# Patient Record
Sex: Female | Born: 1985 | Race: Asian | Hispanic: No | Marital: Married | Smoking: Never smoker
Health system: Southern US, Community
[De-identification: ages and names within clinical notes are randomized; demographics above are authoritative.]

## PROBLEM LIST (undated history)

## (undated) DIAGNOSIS — E039 Hypothyroidism, unspecified: Secondary | ICD-10-CM

## (undated) DIAGNOSIS — N83209 Unspecified ovarian cyst, unspecified side: Secondary | ICD-10-CM

## (undated) DIAGNOSIS — O24419 Gestational diabetes mellitus in pregnancy, unspecified control: Secondary | ICD-10-CM

## (undated) HISTORY — DX: Gestational diabetes mellitus in pregnancy, unspecified control: O24.419

## (undated) HISTORY — PX: NO PAST SURGERIES: SHX2092

## (undated) HISTORY — DX: Hypothyroidism, unspecified: E03.9

## (undated) HISTORY — DX: Unspecified ovarian cyst, unspecified side: N83.209

---

## 2016-06-29 LAB — OB RESULTS CONSOLE GBS: GBS: NEGATIVE

## 2017-01-03 LAB — OB RESULTS CONSOLE ABO/RH: RH Type: POSITIVE

## 2017-01-03 LAB — OB RESULTS CONSOLE GC/CHLAMYDIA
Chlamydia: NEGATIVE
GC PROBE AMP, GENITAL: NEGATIVE

## 2017-01-03 LAB — OB RESULTS CONSOLE HEPATITIS B SURFACE ANTIGEN: Hepatitis B Surface Ag: NEGATIVE

## 2017-01-03 LAB — OB RESULTS CONSOLE ANTIBODY SCREEN: Antibody Screen: NEGATIVE

## 2017-01-03 LAB — OB RESULTS CONSOLE RUBELLA ANTIBODY, IGM: RUBELLA: IMMUNE

## 2017-01-03 LAB — OB RESULTS CONSOLE RPR: RPR: NONREACTIVE

## 2017-01-03 LAB — OB RESULTS CONSOLE HIV ANTIBODY (ROUTINE TESTING): HIV: NONREACTIVE

## 2017-06-29 LAB — OB RESULTS CONSOLE GBS: GBS: NEGATIVE

## 2017-07-11 ENCOUNTER — Other Ambulatory Visit: Payer: Self-pay | Admitting: Obstetrics & Gynecology

## 2017-07-11 ENCOUNTER — Encounter (HOSPITAL_COMMUNITY): Payer: Self-pay | Admitting: *Deleted

## 2017-07-11 ENCOUNTER — Telehealth (HOSPITAL_COMMUNITY): Payer: Self-pay | Admitting: *Deleted

## 2017-07-11 NOTE — Telephone Encounter (Signed)
Preadmission screen  

## 2017-07-20 ENCOUNTER — Inpatient Hospital Stay (HOSPITAL_COMMUNITY)
Admission: AD | Admit: 2017-07-20 | Discharge: 2017-07-23 | DRG: 788 | Disposition: A | Discharge status: Home / Self Care | Payer: 59 | Attending: Obstetrics and Gynecology | Admitting: Obstetrics and Gynecology

## 2017-07-20 ENCOUNTER — Inpatient Hospital Stay (HOSPITAL_COMMUNITY): Payer: 59

## 2017-07-20 ENCOUNTER — Other Ambulatory Visit: Payer: Self-pay

## 2017-07-20 ENCOUNTER — Encounter (HOSPITAL_COMMUNITY): Payer: Self-pay

## 2017-07-20 ENCOUNTER — Inpatient Hospital Stay (HOSPITAL_COMMUNITY): Payer: 59 | Admitting: Anesthesiology

## 2017-07-20 ENCOUNTER — Encounter (HOSPITAL_COMMUNITY)
Admission: AD | Disposition: A | Discharge status: Home / Self Care | Payer: Self-pay | Source: Home / Self Care | Attending: Obstetrics and Gynecology

## 2017-07-20 DIAGNOSIS — O24419 Gestational diabetes mellitus in pregnancy, unspecified control: Secondary | ICD-10-CM

## 2017-07-20 DIAGNOSIS — D649 Anemia, unspecified: Secondary | ICD-10-CM | POA: Diagnosis present

## 2017-07-20 DIAGNOSIS — O9902 Anemia complicating childbirth: Secondary | ICD-10-CM | POA: Diagnosis present

## 2017-07-20 DIAGNOSIS — O4103X0 Oligohydramnios, third trimester, not applicable or unspecified: Principal | ICD-10-CM | POA: Diagnosis present

## 2017-07-20 DIAGNOSIS — Z3490 Encounter for supervision of normal pregnancy, unspecified, unspecified trimester: Secondary | ICD-10-CM | POA: Diagnosis present

## 2017-07-20 DIAGNOSIS — O24425 Gestational diabetes mellitus in childbirth, controlled by oral hypoglycemic drugs: Secondary | ICD-10-CM | POA: Diagnosis present

## 2017-07-20 DIAGNOSIS — E039 Hypothyroidism, unspecified: Secondary | ICD-10-CM | POA: Diagnosis present

## 2017-07-20 DIAGNOSIS — O99284 Endocrine, nutritional and metabolic diseases complicating childbirth: Secondary | ICD-10-CM | POA: Diagnosis present

## 2017-07-20 DIAGNOSIS — Z3A38 38 weeks gestation of pregnancy: Secondary | ICD-10-CM | POA: Diagnosis not present

## 2017-07-20 LAB — RPR: RPR Ser Ql: NONREACTIVE

## 2017-07-20 LAB — GLUCOSE, CAPILLARY
GLUCOSE-CAPILLARY: 89 mg/dL (ref 65–99)
Glucose-Capillary: 103 mg/dL — ABNORMAL HIGH (ref 65–99)
Glucose-Capillary: 115 mg/dL — ABNORMAL HIGH (ref 65–99)

## 2017-07-20 LAB — CBC
HCT: 36.6 % (ref 36.0–46.0)
Hemoglobin: 12.1 g/dL (ref 12.0–15.0)
MCH: 26.7 pg (ref 26.0–34.0)
MCHC: 33.1 g/dL (ref 30.0–36.0)
MCV: 80.6 fL (ref 78.0–100.0)
PLATELETS: 270 10*3/uL (ref 150–400)
RBC: 4.54 MIL/uL (ref 3.87–5.11)
RDW: 13.9 % (ref 11.5–15.5)
WBC: 9.3 10*3/uL (ref 4.0–10.5)

## 2017-07-20 LAB — TYPE AND SCREEN
ABO/RH(D): B POS
ANTIBODY SCREEN: NEGATIVE

## 2017-07-20 LAB — ABO/RH: ABO/RH(D): B POS

## 2017-07-20 SURGERY — Surgical Case
Anesthesia: Epidural

## 2017-07-20 MED ORDER — SIMETHICONE 80 MG PO CHEW
80.0000 mg | CHEWABLE_TABLET | ORAL | Status: DC
  Administered 2017-07-20 – 2017-07-23 (×3): 80 mg via ORAL
  Filled 2017-07-20 (×3): qty 1

## 2017-07-20 MED ORDER — FENTANYL CITRATE (PF) 250 MCG/5ML IJ SOLN
INTRAMUSCULAR | Status: DC | PRN
  Administered 2017-07-20: 100 ug via INTRAVENOUS

## 2017-07-20 MED ORDER — OXYTOCIN BOLUS FROM INFUSION: 500.0000 mL | Freq: Once | INTRAVENOUS | Status: DC

## 2017-07-20 MED ORDER — WITCH HAZEL-GLYCERIN EX PADS: 1.0000 "application " | MEDICATED_PAD | CUTANEOUS | Status: DC | PRN

## 2017-07-20 MED ORDER — CEFAZOLIN SODIUM-DEXTROSE 2-3 GM-%(50ML) IV SOLR
INTRAVENOUS | Status: DC | PRN
  Administered 2017-07-20: 2 g via INTRAVENOUS

## 2017-07-20 MED ORDER — COCONUT OIL OIL: 1.0000 "application " | TOPICAL_OIL | Status: DC | PRN

## 2017-07-20 MED ORDER — EPHEDRINE 5 MG/ML INJ: 10.0000 mg | INTRAVENOUS | Status: DC | PRN

## 2017-07-20 MED ORDER — FLEET ENEMA 7-19 GM/118ML RE ENEM: 1.0000 | ENEMA | RECTAL | Status: DC | PRN

## 2017-07-20 MED ORDER — LEVOTHYROXINE SODIUM 50 MCG PO TABS
50.0000 ug | ORAL_TABLET | Freq: Every day | ORAL | Status: DC
  Administered 2017-07-20: 50 ug via ORAL
  Filled 2017-07-20 (×2): qty 1

## 2017-07-20 MED ORDER — LACTATED RINGERS IV SOLN: 500.0000 mL | INTRAVENOUS | Status: DC | PRN

## 2017-07-20 MED ORDER — ACETAMINOPHEN 325 MG PO TABS: 650.0000 mg | ORAL_TABLET | ORAL | Status: DC | PRN

## 2017-07-20 MED ORDER — TERBUTALINE SULFATE 1 MG/ML IJ SOLN
0.2500 mg | Freq: Once | INTRAMUSCULAR | Status: AC | PRN
  Administered 2017-07-20: 0.25 mg via SUBCUTANEOUS

## 2017-07-20 MED ORDER — SOD CITRATE-CITRIC ACID 500-334 MG/5ML PO SOLN: 30.0000 mL | ORAL | Status: DC | PRN

## 2017-07-20 MED ORDER — LEVOTHYROXINE SODIUM 125 MCG PO TABS
125.0000 ug | ORAL_TABLET | Freq: Every day | ORAL | Status: DC
  Administered 2017-07-21 – 2017-07-23 (×3): 125 ug via ORAL
  Filled 2017-07-20 (×3): qty 1

## 2017-07-20 MED ORDER — PHENYLEPHRINE 40 MCG/ML (10ML) SYRINGE FOR IV PUSH (FOR BLOOD PRESSURE SUPPORT): 80.0000 ug | PREFILLED_SYRINGE | INTRAVENOUS | Status: DC | PRN

## 2017-07-20 MED ORDER — MORPHINE SULFATE (PF) 0.5 MG/ML IJ SOLN
INTRAMUSCULAR | Status: DC | PRN
  Administered 2017-07-20: 1 mg via INTRAVENOUS
  Administered 2017-07-20: 4 mg via EPIDURAL

## 2017-07-20 MED ORDER — SOD CITRATE-CITRIC ACID 500-334 MG/5ML PO SOLN
30.0000 mL | ORAL | Status: DC | PRN
  Administered 2017-07-20: 30 mL via ORAL
  Filled 2017-07-20: qty 15

## 2017-07-20 MED ORDER — LACTATED RINGERS IV SOLN
INTRAVENOUS | Status: DC
  Administered 2017-07-20: 11:00:00 via INTRAUTERINE

## 2017-07-20 MED ORDER — ONDANSETRON HCL 4 MG/2ML IJ SOLN: 4.0000 mg | Freq: Four times a day (QID) | INTRAMUSCULAR | Status: DC | PRN

## 2017-07-20 MED ORDER — MENTHOL 3 MG MT LOZG: 1.0000 | LOZENGE | OROMUCOSAL | Status: DC | PRN

## 2017-07-20 MED ORDER — OXYCODONE-ACETAMINOPHEN 5-325 MG PO TABS: 2.0000 | ORAL_TABLET | ORAL | Status: DC | PRN

## 2017-07-20 MED ORDER — LACTATED RINGERS IV SOLN
INTRAVENOUS | Status: DC
  Administered 2017-07-20: 11:00:00 via INTRAVENOUS

## 2017-07-20 MED ORDER — TERBUTALINE SULFATE 1 MG/ML IJ SOLN: 0.2500 mg | Freq: Once | INTRAMUSCULAR | Status: DC | PRN

## 2017-07-20 MED ORDER — DEXAMETHASONE SODIUM PHOSPHATE 4 MG/ML IJ SOLN
INTRAMUSCULAR | Status: DC | PRN
  Administered 2017-07-20: 4 mg via INTRAVENOUS

## 2017-07-20 MED ORDER — OXYTOCIN 40 UNITS IN LACTATED RINGERS INFUSION - SIMPLE MED
2.5000 [IU]/h | INTRAVENOUS | Status: DC
  Filled 2017-07-20: qty 1000

## 2017-07-20 MED ORDER — OXYTOCIN 40 UNITS IN LACTATED RINGERS INFUSION - SIMPLE MED: 1.0000 m[IU]/min | INTRAVENOUS | Status: DC

## 2017-07-20 MED ORDER — BUPIVACAINE HCL (PF) 0.25 % IJ SOLN
INTRAMUSCULAR | Status: DC | PRN
  Administered 2017-07-20: 10 mL

## 2017-07-20 MED ORDER — OXYCODONE-ACETAMINOPHEN 5-325 MG PO TABS: 1.0000 | ORAL_TABLET | ORAL | Status: DC | PRN

## 2017-07-20 MED ORDER — ONDANSETRON HCL 4 MG/2ML IJ SOLN
4.0000 mg | Freq: Four times a day (QID) | INTRAMUSCULAR | Status: DC | PRN
  Administered 2017-07-20: 4 mg via INTRAVENOUS

## 2017-07-20 MED ORDER — DIPHENHYDRAMINE HCL 50 MG/ML IJ SOLN: 12.5000 mg | INTRAMUSCULAR | Status: DC | PRN

## 2017-07-20 MED ORDER — SIMETHICONE 80 MG PO CHEW
80.0000 mg | CHEWABLE_TABLET | Freq: Three times a day (TID) | ORAL | Status: DC
  Administered 2017-07-22 – 2017-07-23 (×4): 80 mg via ORAL
  Filled 2017-07-20 (×6): qty 1

## 2017-07-20 MED ORDER — FENTANYL CITRATE (PF) 100 MCG/2ML IJ SOLN
INTRAMUSCULAR | Status: AC
  Filled 2017-07-20: qty 2

## 2017-07-20 MED ORDER — LACTATED RINGERS IV SOLN
500.0000 mL | Freq: Once | INTRAVENOUS | Status: AC
  Administered 2017-07-20: 500 mL via INTRAVENOUS

## 2017-07-20 MED ORDER — IBUPROFEN 600 MG PO TABS
600.0000 mg | ORAL_TABLET | Freq: Four times a day (QID) | ORAL | Status: DC
  Administered 2017-07-20 – 2017-07-23 (×10): 600 mg via ORAL
  Filled 2017-07-20 (×10): qty 1

## 2017-07-20 MED ORDER — LIDOCAINE HCL (PF) 1 % IJ SOLN: 30.0000 mL | INTRAMUSCULAR | Status: DC | PRN

## 2017-07-20 MED ORDER — ZOLPIDEM TARTRATE 5 MG PO TABS: 5.0000 mg | ORAL_TABLET | Freq: Every evening | ORAL | Status: DC | PRN

## 2017-07-20 MED ORDER — ALBUMIN HUMAN 5 % IV SOLN
INTRAVENOUS | Status: DC | PRN
  Administered 2017-07-20: 14:00:00 via INTRAVENOUS

## 2017-07-20 MED ORDER — DIPHENHYDRAMINE HCL 25 MG PO CAPS
25.0000 mg | ORAL_CAPSULE | Freq: Four times a day (QID) | ORAL | Status: DC | PRN
  Administered 2017-07-21: 25 mg via ORAL
  Filled 2017-07-20: qty 1

## 2017-07-20 MED ORDER — SENNOSIDES-DOCUSATE SODIUM 8.6-50 MG PO TABS
2.0000 | ORAL_TABLET | ORAL | Status: DC
  Administered 2017-07-20 – 2017-07-23 (×3): 2 via ORAL
  Filled 2017-07-20 (×3): qty 2

## 2017-07-20 MED ORDER — DIBUCAINE 1 % RE OINT: 1.0000 "application " | TOPICAL_OINTMENT | RECTAL | Status: DC | PRN

## 2017-07-20 MED ORDER — SODIUM CHLORIDE 0.9 % IR SOLN
Status: DC | PRN
  Administered 2017-07-20: 1000 mL

## 2017-07-20 MED ORDER — LACTATED RINGERS IV SOLN
INTRAVENOUS | Status: DC
  Administered 2017-07-20: 22:00:00 via INTRAVENOUS

## 2017-07-20 MED ORDER — PHENYLEPHRINE 40 MCG/ML (10ML) SYRINGE FOR IV PUSH (FOR BLOOD PRESSURE SUPPORT)
80.0000 ug | PREFILLED_SYRINGE | INTRAVENOUS | Status: DC | PRN
  Filled 2017-07-20: qty 10

## 2017-07-20 MED ORDER — LACTATED RINGERS IV SOLN: 500.0000 mL | Freq: Once | INTRAVENOUS | Status: DC

## 2017-07-20 MED ORDER — LACTATED RINGERS IV SOLN: INTRAVENOUS | Status: DC

## 2017-07-20 MED ORDER — LIDOCAINE HCL (PF) 1 % IJ SOLN
INTRAMUSCULAR | Status: DC | PRN
  Administered 2017-07-20 (×2): 5 mL via EPIDURAL

## 2017-07-20 MED ORDER — OXYTOCIN 40 UNITS IN LACTATED RINGERS INFUSION - SIMPLE MED: 2.5000 [IU]/h | INTRAVENOUS | Status: AC

## 2017-07-20 MED ORDER — FENTANYL 2.5 MCG/ML BUPIVACAINE 1/10 % EPIDURAL INFUSION (WH - ANES)
14.0000 mL/h | INTRAMUSCULAR | Status: DC | PRN
  Administered 2017-07-20: 12 mL/h via EPIDURAL
  Filled 2017-07-20: qty 100

## 2017-07-20 MED ORDER — OXYTOCIN 10 UNIT/ML IJ SOLN
INTRAMUSCULAR | Status: DC | PRN
  Administered 2017-07-20: 40 [IU] via INTRAVENOUS

## 2017-07-20 MED ORDER — SODIUM BICARBONATE 8.4 % IV SOLN
INTRAVENOUS | Status: DC | PRN
  Administered 2017-07-20 (×2): 5 mL via EPIDURAL

## 2017-07-20 MED ORDER — BUPIVACAINE HCL (PF) 0.25 % IJ SOLN
INTRAMUSCULAR | Status: AC
  Filled 2017-07-20: qty 30

## 2017-07-20 MED ORDER — SCOPOLAMINE 1 MG/3DAYS TD PT72
MEDICATED_PATCH | TRANSDERMAL | Status: DC | PRN
  Administered 2017-07-20: 1 via TRANSDERMAL

## 2017-07-20 MED ORDER — OXYTOCIN 40 UNITS IN LACTATED RINGERS INFUSION - SIMPLE MED
1.0000 m[IU]/min | INTRAVENOUS | Status: DC
  Administered 2017-07-20: 2 m[IU]/min via INTRAVENOUS

## 2017-07-20 MED ORDER — SIMETHICONE 80 MG PO CHEW: 80.0000 mg | CHEWABLE_TABLET | ORAL | Status: DC | PRN

## 2017-07-20 MED ORDER — PRENATAL MULTIVITAMIN CH
1.0000 | ORAL_TABLET | Freq: Every day | ORAL | Status: DC
  Administered 2017-07-21 – 2017-07-23 (×3): 1 via ORAL
  Filled 2017-07-20 (×3): qty 1

## 2017-07-20 MED ORDER — MORPHINE SULFATE (PF) 0.5 MG/ML IJ SOLN
INTRAMUSCULAR | Status: AC
  Filled 2017-07-20: qty 10

## 2017-07-20 MED ORDER — TERBUTALINE SULFATE 1 MG/ML IJ SOLN
0.2500 mg | Freq: Once | INTRAMUSCULAR | Status: DC | PRN
  Filled 2017-07-20: qty 1

## 2017-07-20 SURGICAL SUPPLY — 46 items
BARRIER ADHS 3X4 INTERCEED (GAUZE/BANDAGES/DRESSINGS) ×3 IMPLANT
BENZOIN TINCTURE PRP APPL 2/3 (GAUZE/BANDAGES/DRESSINGS) ×3 IMPLANT
CHLORAPREP W/TINT 26ML (MISCELLANEOUS) ×3 IMPLANT
CLAMP CORD UMBIL (MISCELLANEOUS) ×3 IMPLANT
CLOSURE WOUND 1/2 X4 (GAUZE/BANDAGES/DRESSINGS) ×1
CLOTH BEACON ORANGE TIMEOUT ST (SAFETY) ×3 IMPLANT
DRAPE C SECTION CLR SCREEN (DRAPES) ×3 IMPLANT
DRSG OPSITE POSTOP 4X10 (GAUZE/BANDAGES/DRESSINGS) ×3 IMPLANT
ELECT REM PT RETURN 9FT ADLT (ELECTROSURGICAL) ×3
ELECTRODE REM PT RTRN 9FT ADLT (ELECTROSURGICAL) ×1 IMPLANT
EXTRACTOR VACUUM M CUP 4 TUBE (SUCTIONS) IMPLANT
EXTRACTOR VACUUM M CUP 4' TUBE (SUCTIONS)
GLOVE BIOGEL PI IND STRL 7.0 (GLOVE) ×2 IMPLANT
GLOVE BIOGEL PI INDICATOR 7.0 (GLOVE) ×4
GLOVE ECLIPSE 6.5 STRL STRAW (GLOVE) ×3 IMPLANT
GOWN STRL REUS W/TWL LRG LVL3 (GOWN DISPOSABLE) ×6 IMPLANT
KIT ABG SYR 3ML LUER SLIP (SYRINGE) ×3 IMPLANT
NEEDLE HYPO 22GX1.5 SAFETY (NEEDLE) ×3 IMPLANT
NEEDLE HYPO 25X5/8 SAFETYGLIDE (NEEDLE) IMPLANT
NS IRRIG 1000ML POUR BTL (IV SOLUTION) ×3 IMPLANT
PACK C SECTION WH (CUSTOM PROCEDURE TRAY) ×3 IMPLANT
PAD OB MATERNITY 4.3X12.25 (PERSONAL CARE ITEMS) ×3 IMPLANT
RETRACTOR TRAXI PANNICULUS (MISCELLANEOUS) ×1 IMPLANT
RTRCTR C-SECT PINK 25CM LRG (MISCELLANEOUS) ×3 IMPLANT
SPONGE LAP 18X18 X RAY DECT (DISPOSABLE) ×3 IMPLANT
STRIP CLOSURE SKIN 1/2X4 (GAUZE/BANDAGES/DRESSINGS) ×2 IMPLANT
SUT CHROMIC GUT AB #0 18 (SUTURE) IMPLANT
SUT MNCRL 0 VIOLET CTX 36 (SUTURE) ×3 IMPLANT
SUT MON AB 2-0 SH 27 (SUTURE)
SUT MON AB 2-0 SH27 (SUTURE) IMPLANT
SUT MON AB 3-0 SH 27 (SUTURE)
SUT MON AB 3-0 SH27 (SUTURE) IMPLANT
SUT MON AB 4-0 PS1 27 (SUTURE) IMPLANT
SUT MONOCRYL 0 CTX 36 (SUTURE) ×6
SUT PLAIN 2 0 (SUTURE)
SUT PLAIN 2 0 XLH (SUTURE) ×3 IMPLANT
SUT PLAIN ABS 2-0 CT1 27XMFL (SUTURE) IMPLANT
SUT VIC AB 0 CT1 36 (SUTURE) ×6 IMPLANT
SUT VIC AB 2-0 CT1 27 (SUTURE) ×2
SUT VIC AB 2-0 CT1 TAPERPNT 27 (SUTURE) ×1 IMPLANT
SUT VIC AB 4-0 KS 27 (SUTURE) ×3 IMPLANT
SUT VIC AB 4-0 PS2 27 (SUTURE) IMPLANT
SYR CONTROL 10ML LL (SYRINGE) ×6 IMPLANT
TOWEL OR 17X24 6PK STRL BLUE (TOWEL DISPOSABLE) ×3 IMPLANT
TRAXI PANNICULUS RETRACTOR (MISCELLANEOUS) ×2
TRAY FOLEY W/BAG SLVR 14FR LF (SET/KITS/TRAYS/PACK) IMPLANT

## 2017-07-20 NOTE — Progress Notes (Signed)
S: epidural comfortable O:BP 136/88   Pulse (!) 103   Temp 98.2 F (36.8 C) (Axillary)   Resp 18   Ht 5' 1.02" (1.55 m)   Wt 94.3 kg (208 lb)   SpO2 99%   BMI 39.27 kg/m  VE 6/patulous/-3/-2 (+) scalp stim Palp membrane AROM. Thick meconium IUPC/ISE placed CBG 103  Tracing: baseline 150's-160 (+) variables (+) late component Ctx  q 5 mins IMP; oligohydramnios Class A2 GDM Hypothyroidism IUP@ 38 5/7 wks P) amnioinfusion. Watch FHR closely

## 2017-07-20 NOTE — MAU Note (Signed)
Pt contracting since 1900 last night. Bleeding "like period" since 2130.  Denies LOF. +FM

## 2017-07-20 NOTE — Transfer of Care (Signed)
Immediate Anesthesia Transfer of Care Note  Patient: Heidi Barton  Procedure(s) Performed: CESAREAN SECTION (N/A )  Patient Location: PACU  Anesthesia Type:Epidural  Level of Consciousness: awake and alert   Airway & Oxygen Therapy: Patient Spontanous Breathing  Post-op Assessment: Report given to RN and Post -op Vital signs reviewed and stable  Post vital signs: Reviewed and stable  Last Vitals:  Vitals Value Taken Time  BP    Temp    Pulse    Resp    SpO2      Last Pain:  Vitals:   07/20/17 1114  TempSrc: Axillary  PainSc:          Complications: No apparent anesthesia complications

## 2017-07-20 NOTE — MAU Note (Signed)
0300- CNM Rogers at bedside to confirm Fetal presentation w/US  0315 - CNM Williams at bedside to perform SVE.  Will keep pt on monitor for an hour to evaluate labor as pt is painful and contracting regularly   0356 Called Dr Cherly Hensenousins w/ to report labor eval and NST - strip now non reactive with variables . Will continue to monitor and call Dr Cherly Hensenousins back.    0424Called Dr Cherly Hensenousins back. Strip still not reactive. BPP ordered.    Dr Cherly Hensenousins [facility]

## 2017-07-20 NOTE — Anesthesia Pain Management Evaluation Note (Signed)
  CRNA Pain Management Visit Note  Patient: Heidi BrochureShobana Fritze, 32 y.o., female  "Hello [facility] am a [facility] the [facility]. We have an anesthesia team available at all times to provide care throughout the hospital, including epidural management and anesthesia for C-section. I don't know your plan for the delivery whether it a natural birth, water birth, IV sedation, nitrous supplementation, doula or epidural, but we want to meet your pain goals."   1.Was your pain managed to your expectations on prior hospitalizations?   Yes   2.What is your expectation for pain management during this hospitalization?     Epidural  3.How can we help you reach that goal? Epidural placed and working well  Record the patient's initial score and the patient's pain goal.   Pain: 0  Pain Goal: 5 [facility] wants you to be able to say your pain was always managed very well.  Edison PaceWILKERSON,Mindie Rawdon 07/20/2017

## 2017-07-20 NOTE — H&P (Signed)
Heidi BrochureShobana Barton is a 32 y.o. female presenting @ 38 5/[redacted] weeks gestation for labor check. Pt reportedly had reactive tracing while system was on down time and when placed back on monitor showed 2 variables with ctx but ctx had decreased in frequency. Cervical exam per RN was 1/40%. Pt was therefore sent for BPP based on tracing and was found to be oligohydramnios and BPP 6/8. Pt is now being admitted for oligo   OB History    Gravida  2   Para  1   Term      Preterm      AB      Living  1     SAB      TAB      Ectopic      Multiple      Live Births  1          Past Medical History:  Diagnosis Date  . Gestational diabetes    glyburide  . Hypothyroidism   . Ovarian cyst    Past Surgical History:  Procedure Laterality Date  . NO PAST SURGERIES     Family History: family history includes Diabetes in her father. Social History:  reports that she has never smoked. She has never used smokeless tobacco. She reports that she does not drink alcohol or use drugs.     Maternal Diabetes: Yes:  Diabetes Type:  Insulin/Medication controlled Genetic Screening: Normal Maternal Ultrasounds/Referrals: Normal Fetal Ultrasounds or other Referrals:  None Maternal Substance Abuse:  No Significant Maternal Medications:  Meds include: Syntroid Other: glyburide Significant Maternal Lab Results:  Lab values include: Group B Strep negative Other Comments:  hypothyroidism  Review of Systems  All other systems reviewed and are negative.  History Dilation: 1 Effacement (%): 40 Station: -3 Exam by:: T Lytle RN  Blood pressure 133/85, pulse 82, temperature (!) 97.5 F (36.4 C), temperature source Oral, resp. rate 18. Exam Physical Exam  Constitutional: She is oriented to person, place, and time. She appears well-developed and well-nourished. She appears distressed.  Eyes: EOM are normal.  Neck: Neck supple.  Cardiovascular: Regular rhythm.  Respiratory: Breath sounds normal.   GI: Soft.  Musculoskeletal: She exhibits no edema.  Neurological: She is alert and oriented to person, place, and time.  Skin: Skin is warm and dry.    Prenatal labs: ABO, Rh: B/Positive/-- (11/27 0000) Antibody: Negative (11/27 0000) Rubella: Immune (11/27 0000) RPR: Nonreactive (11/27 0000)  HBsAg: Negative (11/27 0000)  HIV: Non-reactive (11/27 0000)  GBS: Negative (05/23 0000)   Assessment/Plan: Oligohydramnios Class A2GDM Hypothyroidism  IUP @ 38 5/7 weeks P) admit. Routine labs. Continue synthroid. CBG q 2hr, IVF. Analgesic/epidural prn. Intracervical balloon with pitocin augmentation    Taner Rzepka A Stephon Weathers 07/20/2017, 6:08 AM

## 2017-07-20 NOTE — Progress Notes (Signed)
S; notes rectal/vaginal pressure  O:  Amnioinfusion completed Still with variable decels some late but not repetitive VE unchanged. vtx -3  (+) mec fluid( thick)  Tracing: baseline 160 (+) variables some better variabiltiy Ctx q 1-5 mins IMP: arrest of dilation Oligohydramnios Class A2 GDM  hypothyroidism  P) disc with pt and husband that the infusion did not accomplish as much as I wanted ( resolution of variables) however they are not as deep. The infrequent ctx results in unchanged cervical exam , therefore will start pitocin to see if  Cervix Will dilate. If unable to tolerate then will need to proceed with C/S.  Express understanding

## 2017-07-20 NOTE — Anesthesia Postprocedure Evaluation (Signed)
Anesthesia Post Note  Patient: Markeria Edgin  Procedure(s) Performed: CESAREAN SECTION (N/A )     Patient location during evaluation: PACU Anesthesia Type: Epidural Level of consciousness: awake and alert, oriented and patient cooperative Pain management: pain level controlled Vital Signs Assessment: post-procedure vital signs reviewed and stable Respiratory status: spontaneous breathing, nonlabored ventilation and respiratory function stable Cardiovascular status: blood pressure returned to baseline and stable Postop Assessment: epidural receding, patient able to bend at knees and no apparent nausea or vomiting Anesthetic complications: no    Last Vitals:  Vitals:   07/20/17 1400 07/20/17 1415  BP: (!) 113/58 107/62  Pulse: 91 82  Resp: 19 19  Temp:    SpO2: 98% 97%    Last Pain:  Vitals:   07/20/17 1400  TempSrc:   PainSc: 0-No pain   Pain Goal:                 Salathiel Ferrara,E. Aleeha Boline

## 2017-07-20 NOTE — Brief Op Note (Signed)
07/20/2017  1:13 PM  PATIENT:  Heidi Barton  32 y.o. female  PRE-OPERATIVE DIAGNOSIS:  fetal intolerance to labor, Class A2 GDM, IUP @ 38 5/7 weeks  POST-OPERATIVE DIAGNOSIS:  fetal intolerance to labor, Class A2 GDM, IUP @ 38 5/7 weeks   PROCEDURE:  Primary Cesarean section, kerr hysterotomy  SURGEON:  Surgeon(s) and Role:    * Maxie Betterousins, Nocholas Damaso, MD - Primary  PHYSICIAN ASSISTANT:   ASSISTANTS: Marlinda Mikeanya Bailey, cnm   ANESTHESIA:   epidural FINDINGS: live female LOT, cord around legs. Nl tubes and ovaries, thin cord( unable to get cord ph). Post meconium stained placenta EBL:  848 mL   BLOOD ADMINISTERED:none  DRAINS: none   LOCAL MEDICATIONS USED:  MARCAINE     SPECIMEN:  Source of Specimen:  placenta   DISPOSITION OF SPECIMEN:  PATHOLOGY  COUNTS:  YES  TOURNIQUET:  * No tourniquets in log *  DICTATION: .Other Dictation: Dictation Number [id number]  PLAN OF CARE: Admit to inpatient   PATIENT DISPOSITION:  PACU - hemodynamically stable.   Delay start of Pharmacological VTE agent (>24hrs) due to surgical blood loss or risk of bleeding: no

## 2017-07-20 NOTE — Anesthesia Procedure Notes (Signed)
Epidural Patient location during procedure: OB Start time: 07/20/2017 8:10 AM End time: 07/20/2017 8:38 AM  Staffing Anesthesiologist: Jairo BenJackson, Damarcus Reggio, MD Performed: anesthesiologist   Preanesthetic Checklist Completed: patient identified, surgical consent, pre-op evaluation, timeout performed, IV checked, risks and benefits discussed and monitors and equipment checked  Epidural Patient position: sitting Prep: site prepped and draped and DuraPrep Patient monitoring: blood pressure, continuous pulse ox and heart rate Approach: midline Location: L3-L4 Injection technique: LOR air  Needle:  Needle type: Tuohy  Needle gauge: 17 G Needle length: 9 cm Needle insertion depth: 5 cm Catheter type: closed end flexible Catheter size: 19 Gauge Catheter at skin depth: 11 cm Test dose: negative (1% lidocaine)  Assessment Events: blood not aspirated, injection not painful, no injection resistance, negative IV test and no paresthesia  Additional Notes Pt identified in Labor room.  Monitors applied. Working IV access confirmed. Sterile prep, drape lumbar spine.  1% lido local L 2,3.  #17ga Touhy os, repeat local L 3,4 and #17ga Touhy LOR air at 5 cm, cath in easily to 11 cm skin. Test dose OK, cath dosed and infusion begun.  Patient asymptomatic, VSS, no heme aspirated, tolerated well.  Sandford Craze Leticia Mcdiarmid, MDReason for block:procedure for pain

## 2017-07-20 NOTE — Progress Notes (Signed)
S; breathing with ctx Denies SROM  O: BP (!) 142/81   Pulse 89   Temp 98.2 F (36.8 C) (Oral)   Resp 16   Wt 94.3 kg (208 lb)   VE 4/60/-3 Tracing: baseline 150 (+) irreg ctx, (+) variables with ctx  IMP: Labor Oligohydramnios Class A2 GDM Hypothyroidism Variable decelerations due to oligohydramnios P) Epidural. Amniotomy followed by IUPC for amnioinfusion. Defer pitocin augmentation at this time as she has progressed well on her own. Await BS check

## 2017-07-20 NOTE — Anesthesia Preprocedure Evaluation (Addendum)
Anesthesia Evaluation  Patient identified by MRN, date of birth, ID band Patient awake    Reviewed: Allergy & Precautions, NPO status , Patient's Chart, lab work & pertinent test results  History of Anesthesia Complications Negative for: history of anesthetic complications  Airway Mallampati: II  TM Distance: >3 FB Neck ROM: Full    Dental  (+) Dental Advisory Given   Pulmonary neg pulmonary ROS,    breath sounds clear to auscultation       Cardiovascular negative cardio ROS   Rhythm:Regular Rate:Normal     Neuro/Psych negative neurological ROS     GI/Hepatic negative GI ROS, Neg liver ROS,   Endo/Other  diabetes (glu 101), Gestational, Oral Hypoglycemic AgentsHypothyroidism   Renal/GU negative Renal ROS     Musculoskeletal   Abdominal   Peds  Hematology plt 270k   Anesthesia Other Findings   Reproductive/Obstetrics (+) Pregnancy                            Anesthesia Physical Anesthesia Plan  ASA: III  Anesthesia Plan: Epidural   Post-op Pain Management:    Induction:   PONV Risk Score and Plan: 2 and Treatment may vary due to age or medical condition  Airway Management Planned: Natural Airway  Additional Equipment:   Intra-op Plan:   Post-operative Plan:   Informed Consent: I have reviewed the patients History and Physical, chart, labs and discussed the procedure including the risks, benefits and alternatives for the proposed anesthesia with the patient or authorized representative who has indicated his/her understanding and acceptance.   Dental advisory given  Plan Discussed with:   Anesthesia Plan Comments: (Patient identified. Risks/Benefits/Options discussed with patient including but not limited to bleeding, infection, nerve damage, paralysis, failed block, incomplete pain control, headache, blood pressure changes, nausea, vomiting, reactions to medication both or  allergic, itching and postpartum back pain. Confirmed with bedside nurse the patient's most recent platelet count. Confirmed with patient that they are not currently taking any anticoagulation, have any bleeding history or any family history of bleeding disorders. Patient expressed understanding and wished to proceed. All questions were answered. )       Anesthesia Quick Evaluation

## 2017-07-21 LAB — CBC
HEMATOCRIT: 30 % — AB (ref 36.0–46.0)
HEMOGLOBIN: 9.7 g/dL — AB (ref 12.0–15.0)
MCH: 26.2 pg (ref 26.0–34.0)
MCHC: 32.3 g/dL (ref 30.0–36.0)
MCV: 81.1 fL (ref 78.0–100.0)
Platelets: 220 10*3/uL (ref 150–400)
RBC: 3.7 MIL/uL — ABNORMAL LOW (ref 3.87–5.11)
RDW: 14 % (ref 11.5–15.5)
WBC: 13 10*3/uL — AB (ref 4.0–10.5)

## 2017-07-21 MED ORDER — ACETAMINOPHEN 325 MG PO TABS
650.0000 mg | ORAL_TABLET | ORAL | Status: DC | PRN
  Administered 2017-07-21 – 2017-07-23 (×3): 650 mg via ORAL
  Filled 2017-07-21 (×5): qty 2

## 2017-07-21 NOTE — Anesthesia Postprocedure Evaluation (Signed)
Anesthesia Post Note  Patient: Teneil Granda  Procedure(s) Performed: CESAREAN SECTION (N/A )     Patient location during evaluation: Mother Baby Anesthesia Type: Epidural Level of consciousness: awake and alert and oriented Pain management: pain level controlled Vital Signs Assessment: post-procedure vital signs reviewed and stable Respiratory status: spontaneous breathing and nonlabored ventilation Cardiovascular status: stable Postop Assessment: no headache, no backache, patient able to bend at knees, no signs of nausea or vomiting, adequate PO intake and able to ambulate Anesthetic complications: no    Last Vitals:  Vitals:   07/21/17 0249 07/21/17 0618  BP: (!) 104/58 (!) 121/59  Pulse: 64   Resp: 15 16  Temp: 36.8 C 36.6 C  SpO2: 95% 97%    Last Pain:  Vitals:   07/21/17 0620  TempSrc:   PainSc: 0-No pain   Pain Goal:                 Madison HickmanGREGORY,Tjay Velazquez

## 2017-07-21 NOTE — Progress Notes (Addendum)
No c/o tol po, ambulating some; +flatus; voiding w/o difficulty Lochia wnl Pain controlled; pt does ask for abd binder  Temp:  [97.5 F (36.4 C)-98.6 F (37 C)] 97.9 F (36.6 C) (06/14 0618) Pulse Rate:  [64-95] 64 (06/14 0249) Resp:  [13-19] 16 (06/14 0618) BP: (72-126)/(39-76) 121/59 (06/14 0618) SpO2:  [93 %-99 %] 97 % (06/14 0618)  A&ox3 rrr ctab Abd: +bs, soft, nt, nd; fundus firm and 1cm below umb; dressing c/d/i LE: no edema, nt bilat  CBC Latest Ref Rng & Units 07/21/2017 07/20/2017  WBC 4.0 - 10.5 K/uL 13.0(H) 9.3  Hemoglobin 12.0 - 15.0 g/dL 1.9(J9.7(L) 47.812.1  Hematocrit 36.0 - 46.0 % 30.0(L) 36.6  Platelets 150 - 400 K/uL 220 270   A/P: pod 1 s/p ltcs for nrfht 1. Doing well, increase ambulation, contin current care 2. Acute anemia - asymptomatic and plan iron q day pp 3. gdma2 - will check fasting tomorrow am bs and hold meds; will have f/u pp

## 2017-07-21 NOTE — Addendum Note (Signed)
Addendum  created 07/21/17 0806 by Shanon PayorGregory, Aranza Geddes M, CRNA   Sign clinical note

## 2017-07-21 NOTE — Op Note (Signed)
NAMSheldon Silvan: Maler, [facility] MED[facility] REC[facility] [id number] ACCOUNT [id number] DATE OF BIRTH:1985/12/07 FACILITY: [facility] LOCATIO: [id number] PHYSICIAN:Claretha Townshend A. Rhiannon Sassaman, MD  OPERATIVE REPORT  DATE OF PROCEDURE:  07/20/2017  PREOPERATIVE DIAGNOSES:  Fetal intolerance to labor, class A2 gestational diabetes, intrauterine gestation at 38-5/7 weeks, oligohydramnios.  PROCEDURE:  Primary cesarean section, Kerr hysterotomy.  POSTOPERATIVE DIAGNOSES:  Fetal intolerance to labor, class A2 gestational diabetes, intrauterine gestation at 38-5/7 weeks, oligohydramnios.  ANESTHESIA:  Epidural.  SURGEON:  Maxie BetterSheronette Sian Rockers, MD  ASSISTANT:  Marlinda Mikeanya Bailey, CNM  DESCRIPTION OF PROCEDURE:  Under adequate epidural anesthesia, the patient was placed in the supine position with a left lateral tilt.  An indwelling Foley catheter was already in place.  Marcaine 0.25% was injected along the planned Pfannenstiel skin  incision site.  Pfannenstiel skin incision was then made, carried down to the rectus fascia.  The rectus fascia was opened transversely.  The rectus fascia was then bluntly and sharply dissected off the rectus muscle in a superior and inferior fashion.   The rectus muscle was split in the midline.  The parietal peritoneum was entered bluntly and extended.  A self-retaining Alexis retractor was then placed and not well developed lower uterine segment was noted.  Nonetheless, the vesicouterine peritoneum  was opened transversely.  The bladder was gently displaced inferiorly.  A curvilinear  uterine incision was then made, and green meconium amniotic fluid was noted.  Subsequent delivery of a live female from the left occiput transverse position with cord around both legs was noted.  Baby was bulb suctioned on the abdomen, cord was clamped after 1 minute, and Apgars were assigned by the waiting pediatricians at 8 and 8 at one and five minutes respectively.  Placenta was manually removed and sent to pathology.    Attempt at cord pH was unsuccessful as the the cord was thin.  The uterine cavity was cleaned of debris.  Uterine incision with no extension was closed in 2 layers, the first layer with 0 Monocryl in a running locked  stitch.  The second layer was imbricated using 0 Monocryl suture.  Normal tubes and ovaries were noted bilaterally.  The abdomen was irrigated and suctioned of debris.  Interceed was placed in an inverted T fashion overlying the lower uterine segment.   The Alexis retractor was then removed.  The parietal peritoneum was closed with 2-0 Vicryl.  The rectus fascia was closed with 0 Vicryl x2.  The subcutaneous area was irrigated, small bleeders cauterized.  Interrupted 2-0 plain suture was placed and the  skin approximated using 4-0 Vicryl subcuticular closure.  Steri-Strips and benzoin was placed.  SPECIMEN:  Placenta sent to pathology.  ESTIMATED BLOOD LOSS:  848 ml.  INTRAOPERATIVE FLUIDS:  1800 ml.  URINE OUTPUT:  100 mL.  COUNTS:  Sponge and instrument count x2 was correct.  COMPLICATIONS:  None.  DISPOSITION:  The patient tolerated the procedure well and was transferred to recovery room in stable condition.  LN/NUANCE  D:07/20/2017 T:07/21/2017 JOB:[id number]

## 2017-07-22 DIAGNOSIS — O24419 Gestational diabetes mellitus in pregnancy, unspecified control: Secondary | ICD-10-CM

## 2017-07-22 DIAGNOSIS — O9902 Anemia complicating childbirth: Secondary | ICD-10-CM | POA: Diagnosis not present

## 2017-07-22 LAB — GLUCOSE, CAPILLARY: GLUCOSE-CAPILLARY: 97 mg/dL (ref 65–99)

## 2017-07-22 MED ORDER — POLYSACCHARIDE IRON COMPLEX 150 MG PO CAPS
150.0000 mg | ORAL_CAPSULE | Freq: Every day | ORAL | Status: DC
  Administered 2017-07-22 – 2017-07-23 (×2): 150 mg via ORAL
  Filled 2017-07-22 (×2): qty 1

## 2017-07-22 MED ORDER — MAGNESIUM OXIDE 400 (241.3 MG) MG PO TABS
400.0000 mg | ORAL_TABLET | Freq: Every day | ORAL | Status: DC
  Administered 2017-07-22 – 2017-07-23 (×2): 400 mg via ORAL
  Filled 2017-07-22 (×3): qty 1

## 2017-07-22 NOTE — Lactation Note (Signed)
This note was copied from a baby's chart. Lactation Consultation Note  Patient Name: Heidi Barton BrochureShobana Gowens ZOXWR'UToday's Date: 07/22/2017 Reason for consult: Initial assessment;NICU baby Baby is 5244 hours old in the NICU.  Mom has started pumping with symphony pump.  She is putting baby to breast first and states he is sleepy at breast.  Instructed to pump and hand express every 3 hours. Discussed milk coming to volume.  Mom does not have a breast pump at home but plans on calling insurance company.  [facility] today.  Providing Breastmilk For Your Baby in NICU book given to patient.  Breastfeeding consultation services and support information also given to patient.  Maternal Data    Feeding Feeding Type: Formula  LATCH Score                   Interventions    Lactation Tools Discussed/Used Pump Review: Setup, frequency, and cleaning Initiated by:: RN Date initiated:: 07/21/17   Consult Status Consult Status: Follow-up Date: 07/23/17 Follow-up type: In-patient    Huston FoleyMOULDEN, Dayvion Sans S 07/22/2017, 8:56 AM

## 2017-07-22 NOTE — Progress Notes (Signed)
Subjective: POD# 2 Information for the patient's newborn:  Heidi Barton, [name] [[id number]][[id number]]  female  In NICU d/t glucose instability circ declined  Reports feeling sore, walking difficult d/t incision pain. Feeding: breast and bottle Patient reports tolerating PO.  Breast symptoms: pumping Pain controlled with PO meds Denies HA/SOB/C/P/N/V/dizziness. Flatus present. She reports vaginal bleeding as normal, without clots.  She is ambulating slowly, urinating without difficulty.     Objective:   VS:    Vitals:   07/21/17 0618 07/21/17 1333 07/21/17 2229 07/22/17 0606  BP: (!) 121/59 100/84 120/70 126/74  Pulse:  84 97 84  Resp: 16 16 18 18   Temp: 97.9 F (36.6 C) 98.4 F (36.9 C) 98.2 F (36.8 C) (!) 97.5 F (36.4 C)  TempSrc: Oral Axillary Axillary Axillary  SpO2: 97%     Weight:      Height:        No intake or output data in the 24 hours ending 07/22/17 1102      Recent Labs    07/20/17 0620 07/21/17 0657  WBC 9.3 13.0*  HGB 12.1 9.7*  HCT 36.6 30.0*  PLT 270 220     Blood type: --/--/B POS (06/13 0630)  Rubella: Immune (11/27 0000)     Physical Exam:  General: alert, cooperative and no distress Abdomen: pendulous, lax abdominal muscles, soft, NT Incision: clean, dry and intact Uterine Fundus: firm, below umbilicus, nontender Lochia: minimal Ext: no edema, redness or tenderness in the calves or thighs      Assessment/Plan: 32 y.o.   POD# 2. [id number]                  Principal Problem:   1C/S for FITL 6/13 Active Problems:   Encounter for planned induction of labor   Postpartum care following cesarean delivery   GDM, class A2   Maternal anemia, with delivery  - started oral Fe and Mag ox  Doing well, stable.    Breastfeeding support Encourage to ambulate, assistance with abdominal binder provided Routine post-op care  Heidi Barton, CNM, MSN 07/22/2017, 11:02 AM

## 2017-07-23 MED ORDER — OXYCODONE-ACETAMINOPHEN 5-325 MG PO TABS: 1.0000 | ORAL_TABLET | ORAL | 0 refills | Status: DC | PRN

## 2017-07-23 MED ORDER — POLYSACCHARIDE IRON COMPLEX 150 MG PO CAPS: 150.0000 mg | ORAL_CAPSULE | Freq: Every day | ORAL | 1 refills | Status: DC

## 2017-07-23 MED ORDER — SIMETHICONE 80 MG PO CHEW: 80.0000 mg | CHEWABLE_TABLET | ORAL | 0 refills | Status: DC | PRN

## 2017-07-23 MED ORDER — ACETAMINOPHEN 325 MG PO TABS: 650.0000 mg | ORAL_TABLET | ORAL | Status: DC | PRN

## 2017-07-23 MED ORDER — MAGNESIUM OXIDE 400 (241.3 MG) MG PO TABS: 400.0000 mg | ORAL_TABLET | Freq: Every day | ORAL | 1 refills | Status: DC

## 2017-07-23 MED ORDER — SENNOSIDES-DOCUSATE SODIUM 8.6-50 MG PO TABS: 2.0000 | ORAL_TABLET | ORAL | 1 refills | Status: DC

## 2017-07-23 MED ORDER — IBUPROFEN 600 MG PO TABS: 600.0000 mg | ORAL_TABLET | Freq: Four times a day (QID) | ORAL | 0 refills | Status: DC

## 2017-07-23 MED ORDER — COCONUT OIL OIL: 1.0000 "application " | TOPICAL_OIL | 0 refills | Status: DC | PRN

## 2017-07-23 NOTE — Lactation Note (Signed)
This note was copied from a baby's chart. Lactation Consultation Note  Patient Name: Boy Cammy BrochureShobana Hanford WUXLK'GToday's Date: 07/23/2017 Reason for consult: Follow-up assessment;NICU baby Mom is pumping every 3 hours and obtaining a few mls from each breast.  Parents may decide to rent a pump while they wait for pump from insurance company.  Baby still too sleepy to breastfeed.  Instructed to call for assist prn.  Maternal Data    Feeding    LATCH Score                   Interventions    Lactation Tools Discussed/Used     Consult Status Consult Status: Follow-up Follow-up type: Call as needed    Huston FoleyMOULDEN, Jamita Mckelvin S 07/23/2017, 10:36 AM

## 2017-07-23 NOTE — Discharge Summary (Addendum)
OB Discharge Summary     Patient Name: Heidi BrochureShobana Garceau DOB: 06/14/1985 MRN: [id number]  Date of admission: 07/20/2017 Delivering MD: Dorwin Fitzhenry   Date of discharge: 07/23/2017  Admitting diagnosis: oligohydramnios, BPP 6/10, Class A2 GDM, IUP @ 38 5/7 weeks, latent phase labor Intrauterine pregnancy: 852w5d     Secondary diagnosis:  Hypothyroidism.  Additional problems: none     Discharge diagnosis:  Oligohydramnios  Fetal intolerance to labor Hypothyroidism affecting pregnancy Class A2 GDM IUP@ 38 5/7 weeks delivered                                  Augmentation: Pitocin  Complications: None  Hospital course:  Onset of Labor With Unplanned C/S  32 y.o. yo G2P1002 at 482w5d was admitted in Latent Labor on 07/20/2017. Patient had a labor course significant for oligohydramnios and fetal intolerance to labor. Patient received an amnioinfusion which did not improve variable decelerations. Membrane Rupture Time/Date: 10:28 AM ,07/20/2017   The patient went for cesarean section due to Non-Reassuring FHR and remote from delivery, and delivered a Viable infant,07/20/2017  Details of operation can be found in separate operative note. Patient had an uncomplicated postpartum course.  She is ambulating,tolerating a regular diet, passing flatus, and urinating well.  Patient is discharged home in stable condition 07/23/17.  Physical exam  Vitals:   07/22/17 0606 07/22/17 1540 07/22/17 2258 07/23/17 0648  BP: 126/74 126/83 131/86 (!) 145/97  Pulse: 84 88 94 81  Resp: 18 18 17 18   Temp: (!) 97.5 F (36.4 C) 98 F (36.7 C) 97.7 F (36.5 C) 97.8 F (36.6 C)  TempSrc: Axillary Oral Oral Oral  SpO2:  100% 100%   Weight:      Height:       General: alert, cooperative and no distress Lochia: appropriate Uterine Fundus: firm Incision: Healing well with no significant drainage, Dressing is clean, dry, and intact DVT Evaluation: No cords or calf tenderness. No significant calf/ankle  edema. Labs: Lab Results  Component Value Date   WBC 13.0 (H) 07/21/2017   HGB 9.7 (L) 07/21/2017   HCT 30.0 (L) 07/21/2017   MCV 81.1 07/21/2017   PLT 220 07/21/2017   No flowsheet data found.  Discharge instruction: per After Visit Summary and "Baby and Me Booklet".  After visit meds:  Allergies as of 07/23/2017   No Known Allergies     Medication List    TAKE these medications   acetaminophen 325 MG tablet Commonly known as:  TYLENOL Take 2 tablets (650 mg total) by mouth every 4 (four) hours as needed for mild pain.   coconut oil Oil Apply 1 application topically as needed.   ibuprofen 600 MG tablet Commonly known as:  ADVIL,MOTRIN Take 1 tablet (600 mg total) by mouth every 6 (six) hours.   iron polysaccharides 150 MG capsule Commonly known as:  NIFEREX Take 1 capsule (150 mg total) by mouth daily. Start taking on:  07/24/2017   levothyroxine 125 MCG tablet Commonly known as:  SYNTHROID, LEVOTHROID Take 125 mcg by mouth daily before breakfast.   magnesium oxide 400 (241.3 Mg) MG tablet Commonly known as:  MAG-OX Take 1 tablet (400 mg total) by mouth daily. Start taking on:  07/24/2017   oxyCODONE-acetaminophen 5-325 MG tablet Commonly known as:  PERCOCET/ROXICET Take 1 tablet by mouth every 4 (four) hours as needed (pain scale 4-7).   senna-docusate 8.6-50 MG tablet Commonly  known as:  Senokot-S Take 2 tablets by mouth daily. Start taking on:  07/24/2017   simethicone 80 MG chewable tablet Commonly known as:  MYLICON Chew 1 tablet (80 mg total) by mouth as needed for flatulence.       Diet: routine diet  Activity: Advance as tolerated. Pelvic rest for 6 weeks.   Outpatient follow up:6 weeks Follow-up Information     Dr Shea Evans Specialty:  Obstetrics and Gynecology Contact information: [address] [place] [address] [phone]          Postpartum contraception: Not Discussed  Newborn Data: Live born female  Birth  Weight: 7 lb 5.6 oz (3335 g) APGAR: 8, 8  Newborn Delivery   Birth date/time:  07/20/2017 12:36:00 Delivery type:  C-Section, Low Transverse Trial of labor:  Yes C-section categorization:  Primary     Baby Feeding: Bottle and Breast Disposition:NICU for glucose instability, plan to remain inpatient for several additional days   07/23/2017 Neta Mends, CNM

## 2017-07-25 ENCOUNTER — Inpatient Hospital Stay (HOSPITAL_COMMUNITY): Admission: RE | Admit: 2017-07-25 | Payer: 59 | Source: Ambulatory Visit

## 2017-07-25 ENCOUNTER — Encounter (HOSPITAL_COMMUNITY): Payer: Self-pay | Admitting: Obstetrics and Gynecology

## 2017-07-25 NOTE — Addendum Note (Signed)
Addendum  created 07/25/17 0936 by Jairo BenJackson, Barnard Sharps, MD   Intraprocedure Event edited

## 2019-06-20 ENCOUNTER — Other Ambulatory Visit (HOSPITAL_COMMUNITY)
Admission: RE | Admit: 2019-06-20 | Discharge: 2019-06-20 | Disposition: A | Discharge status: Home / Self Care | Payer: 59 | Source: Ambulatory Visit | Attending: Family Medicine | Admitting: Family Medicine

## 2019-06-20 ENCOUNTER — Encounter: Payer: Self-pay | Admitting: Family Medicine

## 2019-06-20 ENCOUNTER — Ambulatory Visit (INDEPENDENT_AMBULATORY_CARE_PROVIDER_SITE_OTHER): Payer: 59 | Admitting: Family Medicine

## 2019-06-20 ENCOUNTER — Other Ambulatory Visit: Payer: Self-pay

## 2019-06-20 VITALS — BP 122/60 | HR 81 | Temp 98.0°F | Ht 63.0 in | Wt 161.8 lb

## 2019-06-20 DIAGNOSIS — Z0000 Encounter for general adult medical examination without abnormal findings: Secondary | ICD-10-CM | POA: Diagnosis not present

## 2019-06-20 DIAGNOSIS — N898 Other specified noninflammatory disorders of vagina: Secondary | ICD-10-CM | POA: Insufficient documentation

## 2019-06-20 DIAGNOSIS — Z789 Other specified health status: Secondary | ICD-10-CM

## 2019-06-20 DIAGNOSIS — E039 Hypothyroidism, unspecified: Secondary | ICD-10-CM | POA: Diagnosis not present

## 2019-06-20 LAB — CBC WITH DIFFERENTIAL/PLATELET
Basophils Absolute: 0.1 10*3/uL (ref 0.0–0.1)
Basophils Relative: 0.7 % (ref 0.0–3.0)
Eosinophils Absolute: 0.1 10*3/uL (ref 0.0–0.7)
Eosinophils Relative: 1.4 % (ref 0.0–5.0)
HCT: 37.4 % (ref 36.0–46.0)
Hemoglobin: 12.1 g/dL (ref 12.0–15.0)
Lymphocytes Relative: 18.9 % (ref 12.0–46.0)
Lymphs Abs: 1.6 10*3/uL (ref 0.7–4.0)
MCHC: 32.4 g/dL (ref 30.0–36.0)
MCV: 82.1 fl (ref 78.0–100.0)
Monocytes Absolute: 0.5 10*3/uL (ref 0.1–1.0)
Monocytes Relative: 5.8 % (ref 3.0–12.0)
Neutro Abs: 6.3 10*3/uL (ref 1.4–7.7)
Neutrophils Relative %: 73.2 % (ref 43.0–77.0)
Platelets: 344 10*3/uL (ref 150.0–400.0)
RBC: 4.55 Mil/uL (ref 3.87–5.11)
RDW: 12.7 % (ref 11.5–15.5)
WBC: 8.6 10*3/uL (ref 4.0–10.5)

## 2019-06-20 LAB — COMPREHENSIVE METABOLIC PANEL
ALT: 9 U/L (ref 0–35)
AST: 11 U/L (ref 0–37)
Albumin: 4.3 g/dL (ref 3.5–5.2)
Alkaline Phosphatase: 78 U/L (ref 39–117)
BUN: 10 mg/dL (ref 6–23)
CO2: 28 mEq/L (ref 19–32)
Calcium: 9.1 mg/dL (ref 8.4–10.5)
Chloride: 104 mEq/L (ref 96–112)
Creatinine, Ser: 0.44 mg/dL (ref 0.40–1.20)
GFR: 161.69 mL/min (ref >60.00)
Glucose, Bld: 94 mg/dL (ref 70–99)
Potassium: 4.3 mEq/L (ref 3.5–5.1)
Sodium: 136 mEq/L (ref 135–145)
Total Bilirubin: 0.3 mg/dL (ref 0.2–1.2)
Total Protein: 7.1 g/dL (ref 6.0–8.3)

## 2019-06-20 LAB — LIPID PANEL
Cholesterol: 208 mg/dL — ABNORMAL HIGH (ref 0–200)
HDL: 49.9 mg/dL (ref >39.00)
LDL Cholesterol: 126 mg/dL — ABNORMAL HIGH (ref 0–99)
NonHDL: 157.79
Total CHOL/HDL Ratio: 4
Triglycerides: 158 mg/dL — ABNORMAL HIGH (ref 0.0–149.0)
VLDL: 31.6 mg/dL (ref 0.0–40.0)

## 2019-06-20 LAB — TSH: TSH: 2.98 u[IU]/mL (ref 0.35–4.50)

## 2019-06-20 LAB — VITAMIN B12: Vitamin B-12: 133 pg/mL — ABNORMAL LOW (ref 211–911)

## 2019-06-20 LAB — VITAMIN D 25 HYDROXY (VIT D DEFICIENCY, FRACTURES): VITD: 11.9 ng/mL — ABNORMAL LOW (ref 30.00–100.00)

## 2019-06-20 LAB — T4, FREE: Free T4: 0.96 ng/dL (ref 0.60–1.60)

## 2019-06-20 NOTE — Progress Notes (Signed)
Patient: Heidi Barton MRN: [id number] DOB: Aug 15, 1985 PCP: Orland Mustard, MD     Subjective:  Chief Complaint  Patient presents with  . New Patient (Initial Visit)    No new concerns at this time.     HPI: The patient is a 34 y.o. female who presents today for annual exam. She denies any changes to past medical history. There have been no recent hospitalizations. They are not following a well balanced diet and exercise plan. Weight has been decreasing steadily. She does have complaints of vaginal odor at times. Can not tell me what exactly it smells like. Denies any itching or discharge. Sexually active with husband only. Would also like me to check her c/s scar. C/S in 2019.   She family hx of colon or breast cancer.   She is a vegetarian and has history of gestational diabetes. Her father has diabetes as well.   hypothyroidism She thinks she was diagnosed last year. Has not had thyroid labs checked in a bit. Does not complain of weight gain, difficulty swallowing, heat or cold intolerance, period irregularity, dry skin. Appears euthyroid.   Immunization History  Administered Date(s) Administered  . PFIZER SARS-COV-2 Vaccination 05/17/2019, 06/07/2019  . Tdap 06/18/2013   Colonoscopy: routine screening  Mammogram: routine screening  Pap smear: she will check on this.    Review of Systems  Constitutional: Negative for chills, fatigue and fever.  HENT: Negative for dental problem, ear pain, hearing loss, sore throat and trouble swallowing.   Eyes: Negative for visual disturbance.  Respiratory: Negative for cough, chest tightness and shortness of breath.   Cardiovascular: Negative for chest pain, palpitations and leg swelling.  Gastrointestinal: Negative for abdominal pain, blood in stool, diarrhea and nausea.  Endocrine: Negative for cold intolerance, polydipsia, polyphagia and polyuria.  Genitourinary: Negative for dysuria and hematuria.  Musculoskeletal: Negative for  arthralgias.  Skin: Negative for rash.  Neurological: Negative for dizziness and headaches.  Psychiatric/Behavioral: Negative for dysphoric mood and sleep disturbance. The patient is not nervous/anxious.     Allergies Patient has No Known Allergies.  Past Medical History Patient  has a past medical history of Gestational diabetes, Hypothyroidism, and Ovarian cyst.  Surgical History Patient  has a past surgical history that includes No past surgeries and Cesarean section (N/A, 07/20/2017).  Family History Pateint's family history includes Diabetes in her father.  Social History Patient  reports that she has never smoked. She has never used smokeless tobacco. She reports that she does not drink alcohol or use drugs.    Objective: Vitals:   06/20/19 1328  BP: 122/60  Pulse: 81  Temp: 98 F (36.7 C)  TempSrc: Temporal  SpO2: 98%  Weight: 161 lb 12.8 oz (73.4 kg)  Height: 5\' 3"  (1.6 m)    Body mass index is 28.66 kg/m.  Physical Exam Vitals reviewed. Exam conducted with a chaperone present.  Constitutional:      Appearance: She is well-developed.  HENT:     Right Ear: External ear normal.     Left Ear: External ear normal.  Eyes:     Conjunctiva/sclera: Conjunctivae normal.     Pupils: Pupils are equal, round, and reactive to light.  Neck:     Thyroid: No thyromegaly.  Cardiovascular:     Rate and Rhythm: Normal rate and regular rhythm.     Heart sounds: Normal heart sounds. No murmur.  Pulmonary:     Effort: Pulmonary effort is normal.     Breath sounds: Normal breath  sounds.  Abdominal:     General: Bowel sounds are normal. There is no distension.     Palpations: Abdomen is soft.     Tenderness: There is no abdominal tenderness.  Genitourinary:    Vagina: Vaginal discharge present.     Comments: Swab taken  Musculoskeletal:     Cervical back: Normal range of motion and neck supple.  Lymphadenopathy:     Cervical: No cervical adenopathy.  Skin:     General: Skin is warm and dry.     Findings: No rash.     Comments: Scar nicely healed.   Neurological:     Mental Status: She is alert and oriented to person, place, and time.     Cranial Nerves: No cranial nerve deficit.     Coordination: Coordination normal.     Deep Tendon Reflexes: Reflexes normal.  Psychiatric:        Behavior: Behavior normal.        Assessment/plan: 1. Annual physical exam Routine lab work today. HM reviewed. Asked that she look to see if UTD on her pap smear. Encouraged exercise and weight loss and discussed weekly guidelines for this. F/u in one year or as needed.  Patient counseling [x]    Nutrition: Stressed importance of moderation in sodium/caffeine intake, saturated fat and cholesterol, caloric balance, sufficient intake of fresh fruits, vegetables, fiber, calcium, iron, and 1 mg of folate supplement per day (for females capable of pregnancy).  [x]    Stressed the importance of regular exercise.   []    Substance Abuse: Discussed cessation/primary prevention of tobacco, alcohol, or other drug use; driving or other dangerous activities under the influence; availability of treatment for abuse.   [x]    Injury prevention: Discussed safety belts, safety helmets, smoke detector, smoking near bedding or upholstery.   [x]    Sexuality: Discussed sexually transmitted diseases, partner selection, use of condoms, avoidance of unintended pregnancy  and contraceptive alternatives.  [x]    Dental health: Discussed importance of regular tooth brushing, flossing, and dental visits.  [x]    Health maintenance and immunizations reviewed. Please refer to Health maintenance section.    - CBC with Differential/Platelet - Comprehensive metabolic panel - Lipid panel  2. Hypothyroidism (acquired) Labs and will refill med if wnl.  - TSH - T4, free  3. Vegetarian diet  - VITAMIN D 25 Hydroxy (Vit-D Deficiency, Fractures) - Vitamin B12   4. Vaginal odor -apithma swab for  BV/candida.   Patient counseling [x]    Nutrition: Stressed importance of moderation in sodium/caffeine intake, saturated fat and cholesterol, caloric balance, sufficient intake of fresh fruits, vegetables, fiber, calcium, iron, and 1 mg of folate supplement per day (for females capable of pregnancy).  [x]    Stressed the importance of regular exercise.   []    Substance Abuse: Discussed cessation/primary prevention of tobacco, alcohol, or other drug use; driving or other dangerous activities under the influence; availability of treatment for abuse.   [x]    Injury prevention: Discussed safety belts, safety helmets, smoke detector, smoking near bedding or upholstery.   [x]    Sexuality: Discussed sexually transmitted diseases, partner selection, use of condoms, avoidance of unintended pregnancy  and contraceptive alternatives.  [x]    Dental health: Discussed importance of regular tooth brushing, flossing, and dental visits.  [x]    Health maintenance and immunizations reviewed. Please refer to Health maintenance section.    Return in about 1 year (around 06/19/2020) for annual/labs .   Orma Flaming, MD [place]  06/20/2019

## 2019-06-20 NOTE — Patient Instructions (Addendum)
-check with your husband about your last pap smear. This is the screen for cervical cancer. Dr. Benjie Karvonen may have done this.   Encourage you to exercise 5x week or 120 minutes weekly.   Preventive Care 36-36 Years Old, Female Preventive care refers to visits with your health care provider and lifestyle choices that can promote health and wellness. This includes:  A yearly physical exam. This may also be called an annual well check.  Regular dental visits and eye exams.  Immunizations.  Screening for certain conditions.  Healthy lifestyle choices, such as eating a healthy diet, getting regular exercise, not using drugs or products that contain nicotine and tobacco, and limiting alcohol use. What can I expect for my preventive care visit? Physical exam Your health care provider will check your:  Height and weight. This may be used to calculate body mass index (BMI), which tells if you are at a healthy weight.  Heart rate and blood pressure.  Skin for abnormal spots. Counseling Your health care provider may ask you questions about your:  Alcohol, tobacco, and drug use.  Emotional well-being.  Home and relationship well-being.  Sexual activity.  Eating habits.  Work and work Statistician.  Method of birth control.  Menstrual cycle.  Pregnancy history. What immunizations do I need?  Influenza (flu) vaccine  This is recommended every year. Tetanus, diphtheria, and pertussis (Tdap) vaccine  You may need a Td booster every 10 years. Varicella (chickenpox) vaccine  You may need this if you have not been vaccinated. Human papillomavirus (HPV) vaccine  If recommended by your health care provider, you may need three doses over 6 months. Measles, mumps, and rubella (MMR) vaccine  You may need at least one dose of MMR. You may also need a second dose. Meningococcal conjugate (MenACWY) vaccine  One dose is recommended if you are age 40-21 years and a first-year college  student living in a residence hall, or if you have one of several medical conditions. You may also need additional booster doses. Pneumococcal conjugate (PCV13) vaccine  You may need this if you have certain conditions and were not previously vaccinated. Pneumococcal polysaccharide (PPSV23) vaccine  You may need one or two doses if you smoke cigarettes or if you have certain conditions. Hepatitis A vaccine  You may need this if you have certain conditions or if you travel or work in places where you may be exposed to hepatitis A. Hepatitis B vaccine  You may need this if you have certain conditions or if you travel or work in places where you may be exposed to hepatitis B. Haemophilus influenzae type b (Hib) vaccine  You may need this if you have certain conditions. You may receive vaccines as individual doses or as more than one vaccine together in one shot (combination vaccines). Talk with your health care provider about the risks and benefits of combination vaccines. What tests do I need?  Blood tests  Lipid and cholesterol levels. These may be checked every 5 years starting at age 36.  Hepatitis C test.  Hepatitis B test. Screening  Diabetes screening. This is done by checking your blood sugar (glucose) after you have not eaten for a while (fasting).  Sexually transmitted disease (STD) testing.  BRCA-related cancer screening. This may be done if you have a family history of breast, ovarian, tubal, or peritoneal cancers.  Pelvic exam and Pap test. This may be done every 3 years starting at age 36. Starting at age 36, this may be  done every 5 years if you have a Pap test in combination with an HPV test. Talk with your health care provider about your test results, treatment options, and if necessary, the need for more tests. Follow these instructions at home: Eating and drinking   Eat a diet that includes fresh fruits and vegetables, whole grains, lean protein, and low-fat  dairy.  Take vitamin and mineral supplements as recommended by your health care provider.  Do not drink alcohol if: ? Your health care provider tells you not to drink. ? You are pregnant, may be pregnant, or are planning to become pregnant.  If you drink alcohol: ? Limit how much you have to 0-1 drink a day. ? Be aware of how much alcohol is in your drink. In the [place] one drink equals one 12 oz bottle of beer (355 mL), one 5 oz glass of wine (148 mL), or one 1 oz glass of hard liquor (44 mL). Lifestyle  Take daily care of your teeth and gums.  Stay active. Exercise for at least 30 minutes on 5 or more days each week.  Do not use any products that contain nicotine or tobacco, such as cigarettes, e-cigarettes, and chewing tobacco. If you need help quitting, ask your health care provider.  If you are sexually active, practice safe sex. Use a condom or other form of birth control (contraception) in order to prevent pregnancy and STIs (sexually transmitted infections). If you plan to become pregnant, see your health care provider for a preconception visit. What's next?  Visit your health care provider once a year for a well check visit.  Ask your health care provider how often you should have your eyes and teeth checked.  Stay up to date on all vaccines. This information is not intended to replace advice given to you by your health care provider. Make sure you discuss any questions you have with your health care provider. Document Revised: 10/05/2017 Document Reviewed: 10/05/2017 Elsevier Patient Education  2020 [employer].

## 2019-06-21 ENCOUNTER — Encounter: Payer: Self-pay | Admitting: Family Medicine

## 2019-06-21 ENCOUNTER — Other Ambulatory Visit: Payer: Self-pay | Admitting: Family Medicine

## 2019-06-21 DIAGNOSIS — E538 Deficiency of other specified B group vitamins: Secondary | ICD-10-CM | POA: Insufficient documentation

## 2019-06-21 DIAGNOSIS — E559 Vitamin D deficiency, unspecified: Secondary | ICD-10-CM | POA: Insufficient documentation

## 2019-06-21 LAB — CERVICOVAGINAL ANCILLARY ONLY
Bacterial Vaginitis (gardnerella): NEGATIVE
Candida Glabrata: NEGATIVE
Candida Vaginitis: NEGATIVE
Comment: NEGATIVE
Comment: NEGATIVE
Comment: NEGATIVE

## 2019-06-21 MED ORDER — LEVOTHYROXINE SODIUM 125 MCG PO TABS: 125.0000 ug | ORAL_TABLET | Freq: Every day | ORAL | 3 refills | Status: DC

## 2019-06-21 MED ORDER — VITAMIN D (ERGOCALCIFEROL) 1.25 MG (50000 UNIT) PO CAPS: ORAL_CAPSULE | ORAL | 0 refills | Status: DC

## 2019-09-22 IMAGING — US US MFM FETAL BPP W/O NON-STRESS
1 series · 10 of 10 positions shown · non-contrast
Comparison: none

[Series 1: us mfm fetal bpp w/o non-stress · 10 acquisitions, 10 frames shown]
[im 1/10]
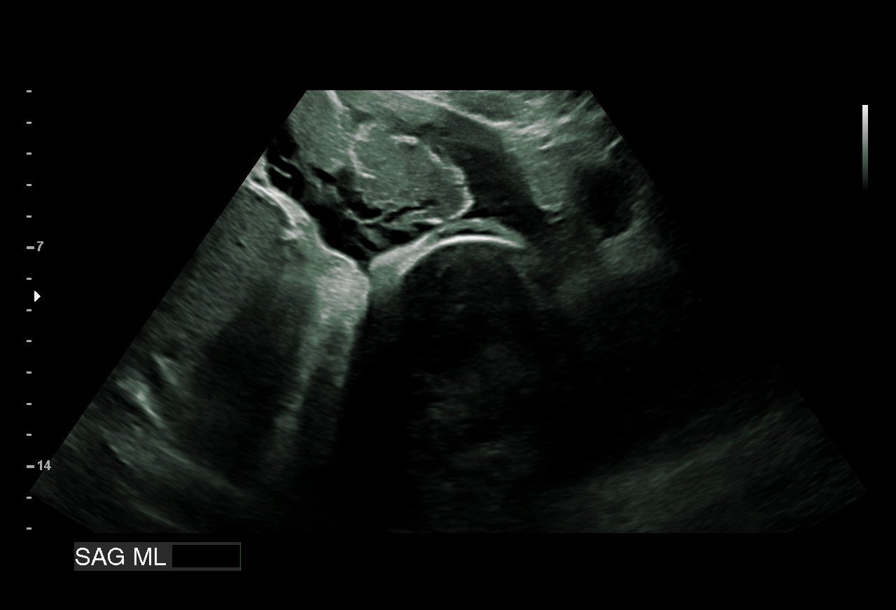
[im 2/10]
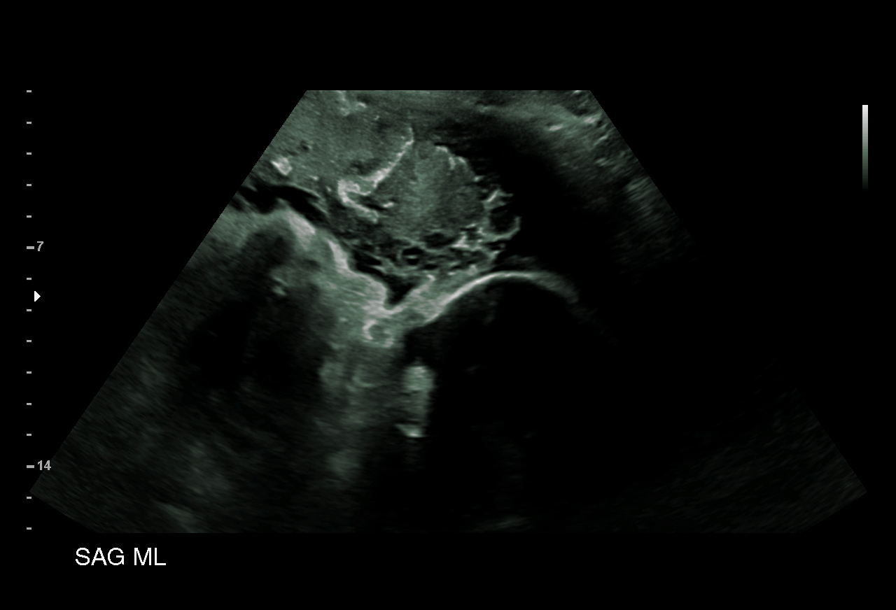
[im 3/10]
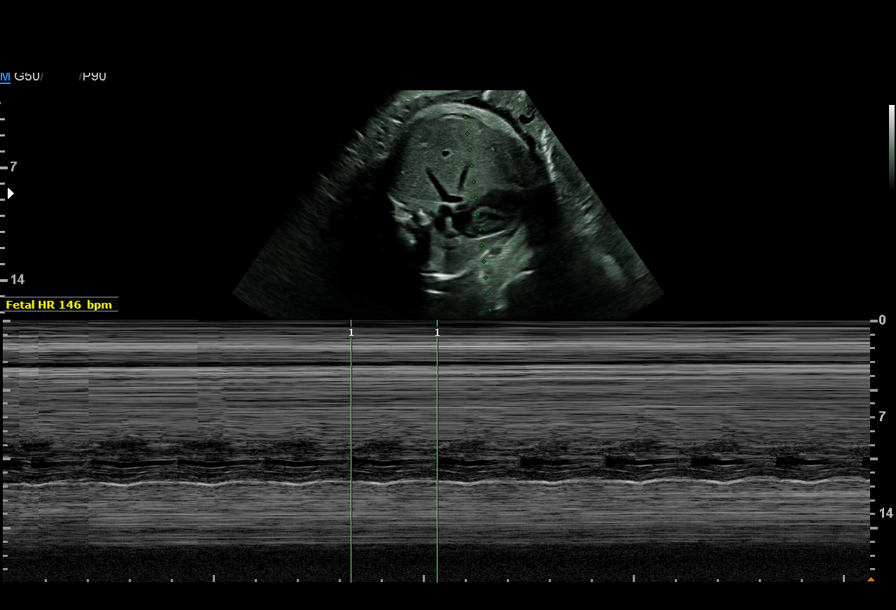
[im 4/10]
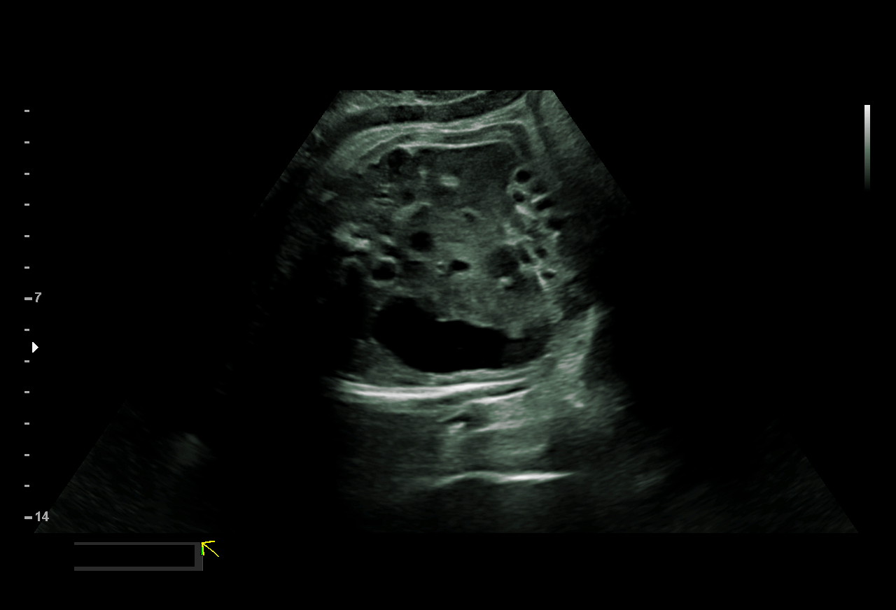
[im 5/10]
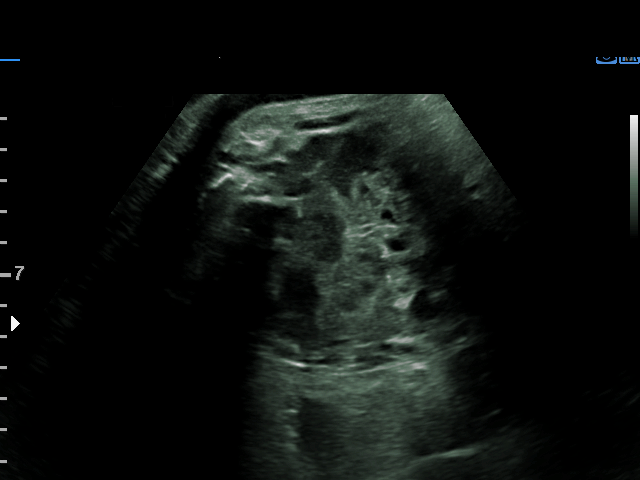
[im 6/10]
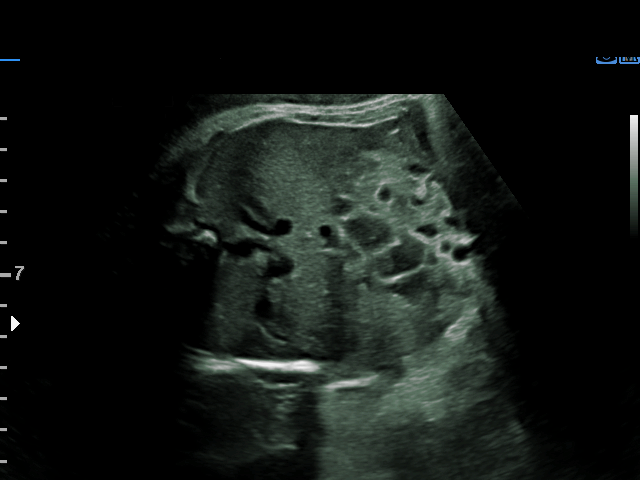
[im 7/10]
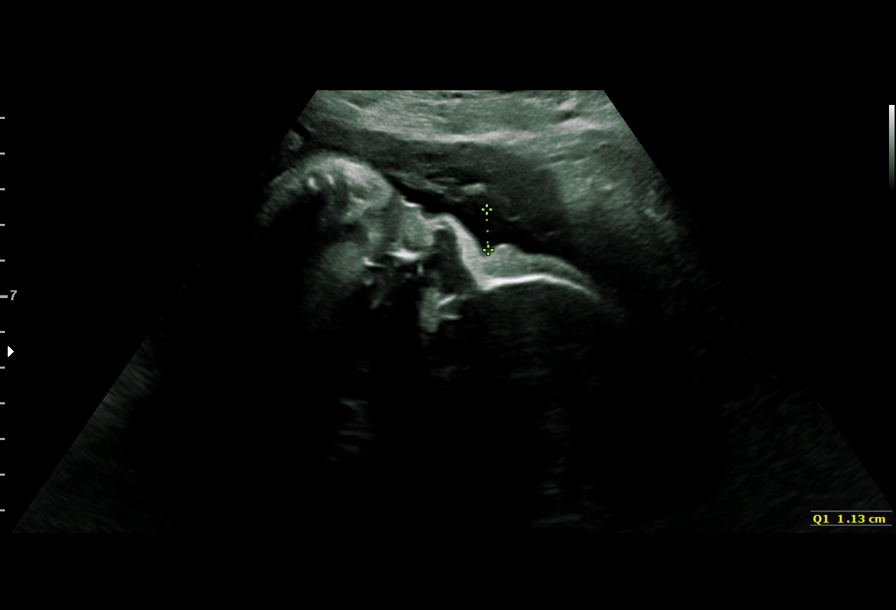
[im 8/10]
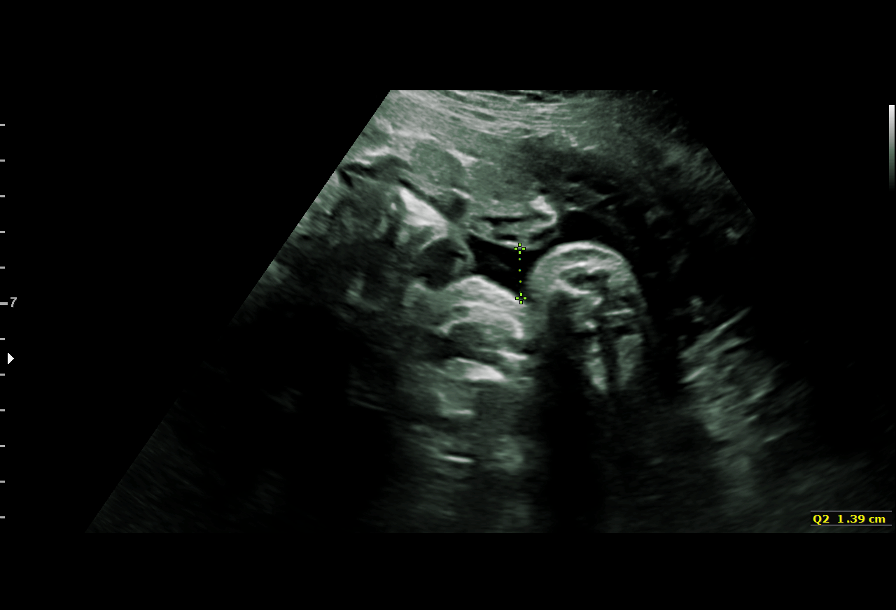
[im 9/10]
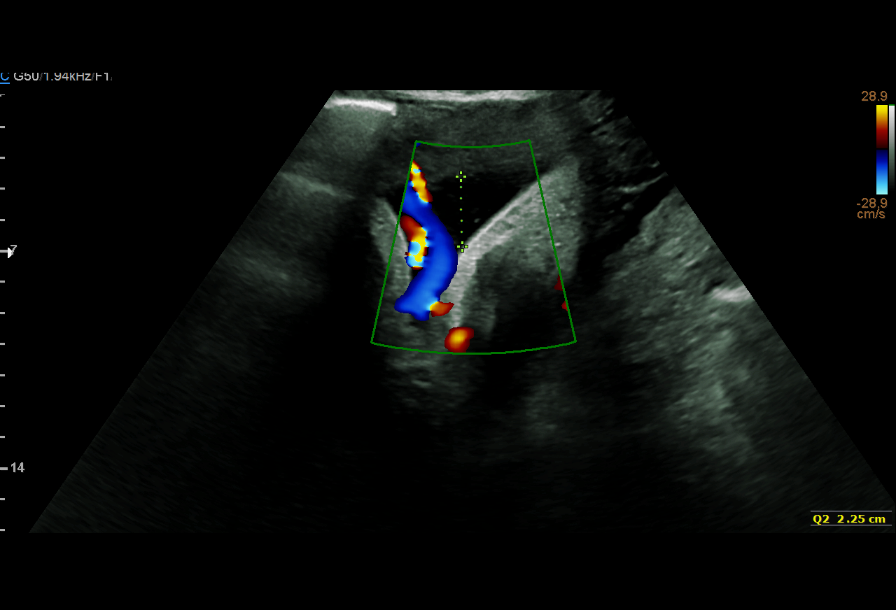
[im 10/10]
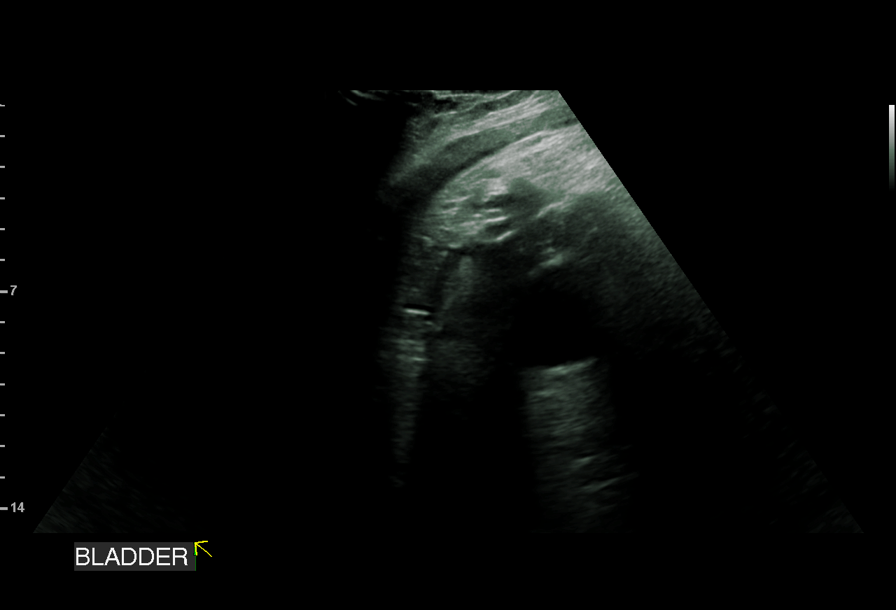

[10 of 10 positions shown; findings below may reference images not displayed]

[facility]
MAU/Triage

1  DAYANA               [id number]      [id number]     [id number]
[place]
Indications

38 weeks gestation of pregnancy
Vaginal bleeding in pregnancy, third trimester
Non-reactive NST
Gestational diabetes in pregnancy,
unspecified control
OB History

Gravidity:    2         Term:   1
Living:       1
Fetal Evaluation

Num Of Fetuses:     1
Fetal Heart         146
Rate(bpm):
Cardiac Activity:   Observed
Presentation:       Cephalic

Amniotic Fluid
AFI FV:      Oligohydramnios

AFI Sum(cm)     %Tile       Largest Pocket(cm)
2.95            < 3

RUQ(cm)                     LUQ(cm)
1.13
Biophysical Evaluation

Amniotic F.V:   Oligohydramnios            F. Tone:        Observed
F. Movement:    Observed                   Score:          [DATE]
F. Breathing:   Observed
Gestational Age

Clinical EDD:  38w 5d                                        EDD:   07/29/17
Best:          38w 5d     Det. By:  Clinical EDD             EDD:   07/29/17
Anatomy

Stomach:               Appears normal, left   Bladder:                Appears normal
sided
Cervix Uterus Adnexa

Cervix
Not visualized (advanced GA >77wks)
Impression

SIUP at 38+5 weeks
Cephalic presentation
Mild polyhydramnios
BPP [DATE] (-2 for oligo)
Recommendations

Follow-up as clinically indicated

## 2019-10-01 ENCOUNTER — Other Ambulatory Visit: Payer: Self-pay | Admitting: Family Medicine

## 2019-10-01 MED ORDER — VITAMIN D (ERGOCALCIFEROL) 1.25 MG (50000 UNIT) PO CAPS: ORAL_CAPSULE | ORAL | 0 refills | Status: DC

## 2019-12-18 ENCOUNTER — Other Ambulatory Visit: Payer: Self-pay | Admitting: Family Medicine

## 2020-06-22 ENCOUNTER — Encounter: Payer: Self-pay | Admitting: Family Medicine

## 2020-06-22 ENCOUNTER — Other Ambulatory Visit: Payer: Self-pay

## 2020-06-22 ENCOUNTER — Ambulatory Visit (INDEPENDENT_AMBULATORY_CARE_PROVIDER_SITE_OTHER): Payer: No Typology Code available for payment source | Admitting: Family Medicine

## 2020-06-22 VITALS — BP 124/84 | HR 82 | Temp 98.3°F | Ht 63.0 in | Wt 172.4 lb

## 2020-06-22 DIAGNOSIS — E538 Deficiency of other specified B group vitamins: Secondary | ICD-10-CM | POA: Diagnosis not present

## 2020-06-22 DIAGNOSIS — Z1159 Encounter for screening for other viral diseases: Secondary | ICD-10-CM

## 2020-06-22 DIAGNOSIS — Z0000 Encounter for general adult medical examination without abnormal findings: Secondary | ICD-10-CM

## 2020-06-22 DIAGNOSIS — E039 Hypothyroidism, unspecified: Secondary | ICD-10-CM

## 2020-06-22 DIAGNOSIS — E559 Vitamin D deficiency, unspecified: Secondary | ICD-10-CM

## 2020-06-22 LAB — CBC WITH DIFFERENTIAL/PLATELET
Basophils Absolute: 0.1 10*3/uL (ref 0.0–0.1)
Basophils Relative: 0.8 % (ref 0.0–3.0)
Eosinophils Absolute: 0.3 10*3/uL (ref 0.0–0.7)
Eosinophils Relative: 4.8 % (ref 0.0–5.0)
HCT: 37.8 % (ref 36.0–46.0)
Hemoglobin: 12.3 g/dL (ref 12.0–15.0)
Lymphocytes Relative: 24.6 % (ref 12.0–46.0)
Lymphs Abs: 1.8 10*3/uL (ref 0.7–4.0)
MCHC: 32.6 g/dL (ref 30.0–36.0)
MCV: 79.9 fl (ref 78.0–100.0)
Monocytes Absolute: 0.4 10*3/uL (ref 0.1–1.0)
Monocytes Relative: 5.2 % (ref 3.0–12.0)
Neutro Abs: 4.6 10*3/uL (ref 1.4–7.7)
Neutrophils Relative %: 64.6 % (ref 43.0–77.0)
Platelets: 335 10*3/uL (ref 150.0–400.0)
RBC: 4.73 Mil/uL (ref 3.87–5.11)
RDW: 13.3 % (ref 11.5–15.5)
WBC: 7.2 10*3/uL (ref 4.0–10.5)

## 2020-06-22 LAB — COMPREHENSIVE METABOLIC PANEL
ALT: 11 U/L (ref 0–35)
AST: 12 U/L (ref 0–37)
Albumin: 4.3 g/dL (ref 3.5–5.2)
Alkaline Phosphatase: 74 U/L (ref 39–117)
BUN: 8 mg/dL (ref 6–23)
CO2: 24 mEq/L (ref 19–32)
Calcium: 9.6 mg/dL (ref 8.4–10.5)
Chloride: 104 mEq/L (ref 96–112)
Creatinine, Ser: 0.46 mg/dL (ref 0.40–1.20)
GFR: 122.51 mL/min (ref >60.00)
Glucose, Bld: 81 mg/dL (ref 70–99)
Potassium: 4.2 mEq/L (ref 3.5–5.1)
Sodium: 137 mEq/L (ref 135–145)
Total Bilirubin: 0.4 mg/dL (ref 0.2–1.2)
Total Protein: 7.4 g/dL (ref 6.0–8.3)

## 2020-06-22 LAB — TSH: TSH: 4.2 u[IU]/mL (ref 0.35–4.50)

## 2020-06-22 LAB — LIPID PANEL
Cholesterol: 206 mg/dL — ABNORMAL HIGH (ref 0–200)
HDL: 54.3 mg/dL (ref >39.00)
LDL Cholesterol: 128 mg/dL — ABNORMAL HIGH (ref 0–99)
NonHDL: 151.83
Total CHOL/HDL Ratio: 4
Triglycerides: 119 mg/dL (ref 0.0–149.0)
VLDL: 23.8 mg/dL (ref 0.0–40.0)

## 2020-06-22 LAB — VITAMIN D 25 HYDROXY (VIT D DEFICIENCY, FRACTURES): VITD: 15.88 ng/mL — ABNORMAL LOW (ref 30.00–100.00)

## 2020-06-22 LAB — VITAMIN B12: Vitamin B-12: 120 pg/mL — ABNORMAL LOW (ref 211–911)

## 2020-06-22 LAB — T4, FREE: Free T4: 0.94 ng/dL (ref 0.60–1.60)

## 2020-06-22 MED ORDER — LEVOTHYROXINE SODIUM 125 MCG PO TABS: 125.0000 ug | ORAL_TABLET | Freq: Every day | ORAL | 3 refills | Status: DC

## 2020-06-22 NOTE — Patient Instructions (Signed)
-look at ways to increase B12 in your diet since vegetarian. If level is low we can either replace with shots or I will have you start B12 pills daily (1055mg/day), but let's get labs back first.   Same with vitamin D. This will be a separate pill outside of a multivitamin. We will see how lab is first.   Really encourage more exercise. 156mutes/week where you are working hard enough you can't carry on conversation.   Check to see about pap smear (screening for cervical cancer). If not would come back for this at your convenience.   Preventive Care [id number] Old, Female Preventive care refers to lifestyle choices and visits with your health care provider that can promote health and wellness. This includes:  A yearly physical exam. This is also called an annual wellness visit.  Regular dental and eye exams.  Immunizations.  Screening for certain conditions.  Healthy lifestyle choices, such as: ? Eating a healthy diet. ? Getting regular exercise. ? Not using drugs or products that contain nicotine and tobacco. ? Limiting alcohol use. What can I expect for my preventive care visit? Physical exam Your health care provider may check your:  Height and weight. These may be used to calculate your BMI (body mass index). BMI is a measurement that tells if you are at a healthy weight.  Heart rate and blood pressure.  Body temperature.  Skin for abnormal spots. Counseling Your health care provider may ask you questions about your:  Past medical problems.  Family's medical history.  Alcohol, tobacco, and drug use.  Emotional well-being.  Home life and relationship well-being.  Sexual activity.  Diet, exercise, and sleep habits.  Work and work enStatistician Access to firearms.  Method of birth control.  Menstrual cycle.  Pregnancy history. What immunizations do I need? Vaccines are usually given at various ages, according to a schedule. Your health care provider  will recommend vaccines for you based on your age, medical history, and lifestyle or other factors, such as travel or where you work.   What tests do I need? Blood tests  Lipid and cholesterol levels. These may be checked every 5 years starting at age 37 Hepatitis C test.  Hepatitis B test. Screening  Diabetes screening. This is done by checking your blood sugar (glucose) after you have not eaten for a while (fasting).  STD (sexually transmitted disease) testing, if you are at risk.  BRCA-related cancer screening. This may be done if you have a family history of breast, ovarian, tubal, or peritoneal cancers.  Pelvic exam and Pap test. This may be done every 3 years starting at age 2251Starting at age 1231this may be done every 5 years if you have a Pap test in combination with an HPV test. Talk with your health care provider about your test results, treatment options, and if necessary, the need for more tests.   Follow these instructions at home: Eating and drinking  Eat a healthy diet that includes fresh fruits and vegetables, whole grains, lean protein, and low-fat dairy products.  Take vitamin and mineral supplements as recommended by your health care provider.  Do not drink alcohol if: ? Your health care provider tells you not to drink. ? You are pregnant, may be pregnant, or are planning to become pregnant.  If you drink alcohol: ? Limit how much you have to 0-1 drink a day. ? Be aware of how much alcohol is in your drink. In the [place]  one drink equals one 12 oz bottle of beer (355 mL), one 5 oz glass of wine (148 mL), or one 1 oz glass of hard liquor (44 mL).   Lifestyle  Take daily care of your teeth and gums. Brush your teeth every morning and night with fluoride toothpaste. Floss one time each day.  Stay active. Exercise for at least 30 minutes 5 or more days each week.  Do not use any products that contain nicotine or tobacco, such as cigarettes, e-cigarettes, and  chewing tobacco. If you need help quitting, ask your health care provider.  Do not use drugs.  If you are sexually active, practice safe sex. Use a condom or other form of protection to prevent STIs (sexually transmitted infections).  If you do not wish to become pregnant, use a form of birth control. If you plan to become pregnant, see your health care provider for a prepregnancy visit.  Find healthy ways to cope with stress, such as: ? Meditation, yoga, or listening to music. ? Journaling. ? Talking to a trusted person. ? Spending time with friends and family. Safety  Always wear your seat belt while driving or riding in a vehicle.  Do not drive: ? If you have been drinking alcohol. Do not ride with someone who has been drinking. ? When you are tired or distracted. ? While texting.  Wear a helmet and other protective equipment during sports activities.  If you have firearms in your house, make sure you follow all gun safety procedures.  Seek help if you have been physically or sexually abused. What's next?  Go to your health care provider once a year for an annual wellness visit.  Ask your health care provider how often you should have your eyes and teeth checked.  Stay up to date on all vaccines. This information is not intended to replace advice given to you by your health care provider. Make sure you discuss any questions you have with your health care provider. Document Revised: 09/22/2019 Document Reviewed: 10/05/2017 Elsevier Patient Education  2021 [employer].

## 2020-06-22 NOTE — Progress Notes (Signed)
Patient: Heidi Barton MRN: [id number] DOB: [birth year] PCP: Orland Mustard, MD     Subjective:  Chief Complaint  Patient presents with  . Annual Exam  . Hypothyroidism  . Vitamin D Deficiency  . Vitamin B12 Deficiency    HPI: The patient is a 35 y.o. female who presents today for annual exam. She denies any changes to past medical history. There have been no recent hospitalizations. They are following a well balanced diet and exercise plan. She does sit ups once weekly. Weight has been fluctuating a bit. No complaints today. No family hx of colon or breast cancer. Has hx of diabetes in her father.   Hx of hypothyroidism. Currently on of synthroid. She denies any temperature intolerance, dysphagia, enlarged thyroid, etc. She appears euthyroid.   Vitamin D and b12 deficiencies. She takes 2 multivitamins, no D3 or B12 tablets. She has no fatigue, tingling or neuro complaints. She is a vegetarian.   Immunization History  Administered Date(s) Administered  . PFIZER(Purple Top)SARS-COV-2 Vaccination 05/17/2019, 06/07/2019, 01/25/2020  . Tdap 06/18/2013   Colonoscopy: routine screening Mammogram: routine screening  Pap smear: unsure, forgot to check for her records.    Review of Systems  Constitutional: Negative for chills, fatigue and fever.  HENT: Negative for dental problem, ear pain, hearing loss and trouble swallowing.   Eyes: Negative for visual disturbance.  Respiratory: Negative for cough, chest tightness and shortness of breath.   Cardiovascular: Negative for chest pain, palpitations and leg swelling.  Gastrointestinal: Negative for abdominal pain, blood in stool, diarrhea and nausea.  Endocrine: Negative for cold intolerance, polydipsia, polyphagia and polyuria.  Genitourinary: Negative for dysuria, hematuria and pelvic pain.  Musculoskeletal: Negative for arthralgias and back pain.  Skin: Negative for rash.  Neurological: Negative for dizziness and headaches.   Psychiatric/Behavioral: Negative for dysphoric mood and sleep disturbance. The patient is not nervous/anxious.     Allergies Patient has No Known Allergies.  Past Medical History Patient  has a past medical history of Gestational diabetes, Hypothyroidism, and Ovarian cyst.  Surgical History Patient  has a past surgical history that includes No past surgeries and Cesarean section (N/A, 07/20/2017).  Family History Pateint's family history includes Diabetes in her father.  Social History Patient  reports that she has never smoked. She has never used smokeless tobacco. She reports that she does not drink alcohol and does not use drugs.    Objective: Vitals:   06/22/20 0900  BP: 124/84  Pulse: 82  Temp: 98.3 F (36.8 C)  TempSrc: Temporal  SpO2: 100%  Weight: 172 lb 6.4 oz (78.2 kg)  Height: 5\' 3"  (1.6 m)    Body mass index is 30.54 kg/m.  Physical Exam Vitals reviewed.  Constitutional:      Appearance: Normal appearance. She is well-developed. She is obese.  HENT:     Head: Normocephalic and atraumatic.     Right Ear: Tympanic membrane, ear canal and external ear normal.     Left Ear: Tympanic membrane, ear canal and external ear normal.     Nose: Nose normal.     Mouth/Throat:     Mouth: Mucous membranes are moist.  Eyes:     Extraocular Movements: Extraocular movements intact.     Conjunctiva/sclera: Conjunctivae normal.     Pupils: Pupils are equal, round, and reactive to light.  Neck:     Thyroid: No thyromegaly.     Vascular: No carotid bruit.     Comments: No goiter Cardiovascular:  Rate and Rhythm: Normal rate and regular rhythm.     Pulses: Normal pulses.     Heart sounds: Normal heart sounds. No murmur heard.   Pulmonary:     Effort: Pulmonary effort is normal.     Breath sounds: Normal breath sounds.  Abdominal:     General: Bowel sounds are normal. There is no distension.     Palpations: Abdomen is soft.     Tenderness: There is no  abdominal tenderness.  Musculoskeletal:     Cervical back: Normal range of motion and neck supple.  Lymphadenopathy:     Cervical: No cervical adenopathy.  Skin:    General: Skin is warm and dry.     Capillary Refill: Capillary refill takes less than 2 seconds.     Findings: No rash.  Neurological:     General: No focal deficit present.     Mental Status: She is alert and oriented to person, place, and time.     Cranial Nerves: No cranial nerve deficit.     Coordination: Coordination normal.     Deep Tendon Reflexes: Reflexes normal.  Psychiatric:        Mood and Affect: Mood normal.        Behavior: Behavior normal.    Flowsheet Row Office Visit from 06/22/2020 in [facility]  PHQ-2 Total Score 0          Assessment/plan: 1. Annual physical exam Routine fasting labs today. HM reviewed. Discussed pap smear. She will check records and if needs come back or do at her annual. Discussed importance of more exercise and guidelines of 150 minutes/week of exercise. Encouraged weight loss. F/u in one year.  Patient counseling [x]    Nutrition: Stressed importance of moderation in sodium/caffeine intake, saturated fat and cholesterol, caloric balance, sufficient intake of fresh fruits, vegetables, fiber, calcium, iron, and 1 mg of folate supplement per day (for females capable of pregnancy).  [x]    Stressed the importance of regular exercise.   []    Substance Abuse: Discussed cessation/primary prevention of tobacco, alcohol, or other drug use; driving or other dangerous activities under the influence; availability of treatment for abuse.   [x]    Injury prevention: Discussed safety belts, safety helmets, smoke detector, smoking near bedding or upholstery.   [x]    Sexuality: Discussed sexually transmitted diseases, partner selection, use of condoms, avoidance of unintended pregnancy  and contraceptive alternatives.  [x]    Dental health: Discussed importance of regular  tooth brushing, flossing, and dental visits.  [x]    Health maintenance and immunizations reviewed. Please refer to Health maintenance section.    - CBC with Differential/Platelet - Comprehensive metabolic panel - Lipid panel  2. Hypothyroidism (acquired) Appears euthyroid. F/u in one year. Refills given.  - T4, free - TSH  3. Vitamin D deficiency Discussed needs to take a separate D3 pill, but we will see what levels are first.  - VITAMIN D 25 Hydroxy (Vit-D Deficiency, Fractures)  4. B12 deficiency -never followed up on this nor did injection vs. Oral repletion. On vegetarian diet, but taking 2 MV/day. Discussed looking into ways to increase her b12 via diet and discussed correction if still low.  - Vitamin B12  5. Encounter for hepatitis C screening test for low risk patient  - Hepatitis C antibody    Return in about 1 year (around 06/22/2021) for annual/pap with alyssa .     , MD [facility]  06/22/2020

## 2020-06-23 LAB — HEPATITIS C ANTIBODY
Hepatitis C Ab: NONREACTIVE
SIGNAL TO CUT-OFF: 0 (ref <1.00)

## 2020-06-24 ENCOUNTER — Other Ambulatory Visit: Payer: Self-pay | Admitting: Family Medicine

## 2020-06-24 MED ORDER — VITAMIN D (ERGOCALCIFEROL) 1.25 MG (50000 UNIT) PO CAPS: ORAL_CAPSULE | ORAL | 0 refills | Status: DC

## 2020-09-15 ENCOUNTER — Other Ambulatory Visit: Payer: Self-pay | Admitting: Family Medicine

## 2021-04-15 ENCOUNTER — Encounter: Payer: Self-pay | Admitting: Family Medicine

## 2021-04-15 ENCOUNTER — Ambulatory Visit (INDEPENDENT_AMBULATORY_CARE_PROVIDER_SITE_OTHER): Payer: No Typology Code available for payment source | Admitting: Family Medicine

## 2021-04-15 VITALS — BP 128/84 | HR 88 | Temp 98.6°F | Ht 63.0 in | Wt 171.0 lb

## 2021-04-15 DIAGNOSIS — E559 Vitamin D deficiency, unspecified: Secondary | ICD-10-CM

## 2021-04-15 DIAGNOSIS — E039 Hypothyroidism, unspecified: Secondary | ICD-10-CM

## 2021-04-15 DIAGNOSIS — E538 Deficiency of other specified B group vitamins: Secondary | ICD-10-CM

## 2021-04-15 MED ORDER — LEVOTHYROXINE SODIUM 125 MCG PO TABS: 125.0000 ug | ORAL_TABLET | Freq: Every day | ORAL | 3 refills | Status: DC

## 2021-04-15 NOTE — Assessment & Plan Note (Signed)
She is on a multivitamin.  She will need to have vitamin D rechecked with next blood draw. ?

## 2021-04-15 NOTE — Progress Notes (Signed)
? ?  Heidi Barton is a 36 y.o. female who presents today for an office visit. ? ?Assessment/Plan:  ?New/Acute Problems: ?Headache ?No red flags.  Reassuring neurologic exam today - doubt acute process or need to obtain any imaging at this point.  Likely secondary to untreated hypothyroidism and possibly dehydration.  We will restart her Synthroid as below and encouraged her to increase fluid intake.  She will let us know if not improving by next week. ? ?Chronic Problems Addressed Today: ?Hypothyroidism (acquired) ?She has been off of Synthroid for a few months due to her prescription running out.  We will refill today.  She will come back next month to establish with a new PCP.  We can recheck TSH at that time. ? ?Vitamin D deficiency ?She is on a multivitamin.  She will need to have vitamin D rechecked with next blood draw. ? ?B12 deficiency ?On multivitamin.  Needs B12 recheck next blood draw. ? ? ?  ?Subjective:  ?HPI: ? ?Patient here with headaches for the past 3 days.. Symptoms have been same for the last few days. She notes symptoms get worse whenever she bend down to pick something. Her husband notes she recently changed her diet and thinks this could be contributing. She is doing intermittent diet. She has tried ibuprofen with no relief. She notes sleeping helps with the symptoms. However, she get headache after waking up from sleep. She has been not drinking enough fluids as well. No weakness or numbness. No reported nausea or vomiting. No fever or chills. No vision changes. She denies any hx of migraine.  ? ?She notes she has been off of her thyroid medication for the past couple of months. She was on Synthroid 125 mcg in the past. Her husband notes she has been feeling more fatigued since being off of the medication. No reported fever or chills.  ? ?   ?  ?Objective:  ?Physical Exam: ?BP 128/84 (BP Location: Right Arm)   Pulse 88   Temp 98.6 ?F (37 ?C) (Temporal)   Ht 5\' 3"  (1.6 m)   Wt 171 lb  (77.6 kg)   SpO2 97%   BMI 30.29 kg/m?   ?Gen: No acute distress, resting comfortably ?CV: Regular rate and rhythm with no murmurs appreciated ?Pulm: Normal work of breathing, clear to auscultation bilaterally with no crackles, wheezes, or rhonchi ?Neuro: Cranial nerves II through XII intact.  Finger-nose-finger intact bilaterally.  Sensation to light touch intact throughout.  Reflexes 2+ and symmetric bilaterally.  Strength 5 out of 5 in all fields. ?Psych: Normal affect and thought content ? ?   ? ? ?I,Savera Zaman,acting as a for Neurosurgeon, MD.,have documented all relevant documentation on the behalf of Heidi Doe, MD,as directed by  Heidi Doe, MD while in the presence of Heidi Doe, MD.  ? ?I, Heidi Doe, MD, have reviewed all documentation for this visit. The documentation on 04/15/21 for the exam, diagnosis, procedures, and orders are all accurate and complete. ? ?06/15/21. Katina Degree, MD ?04/15/2021 3:43 PM  ? ?

## 2021-04-15 NOTE — Assessment & Plan Note (Signed)
On multivitamin.  Needs B12 recheck next blood draw. ?

## 2021-04-15 NOTE — Assessment & Plan Note (Signed)
She has been off of Synthroid for a few months due to her prescription running out.  We will refill today.  She will come back next month to establish with a new PCP.  We can recheck TSH at that time. ?

## 2021-04-15 NOTE — Patient Instructions (Signed)
It was very nice to see you today! ? ?We need to get you back on your thyroid medication.  I will refill your prescription today.  Please come back soon to establish with a new PCP and have blood work done. ? ?Let us know if your headache does not improve the next several days. ? ?Take care, ?Dr Jimmey Ralph ? ?PLEASE NOTE: ? ?If you had any lab tests please let us know if you have not heard back within a few days. You may see your results on mychart before we have a chance to review them but we will give you a call once they are reviewed by [place]. If we ordered any referrals today, please let us know if you have not heard from their office within the next week.  ? ?Please try these tips to maintain a healthy lifestyle: ? ?Eat at least 3 REAL meals and 1-2 snacks per day.  Aim for no more than 5 hours between eating.  If you eat breakfast, please do so within one hour of getting up.  ? ?Each meal should contain half fruits/vegetables, one quarter protein, and one quarter carbs (no bigger than a computer mouse) ? ?Cut down on sweet beverages. This includes juice, soda, and sweet tea.  ? ?Drink at least 1 glass of water with each meal and aim for at least 8 glasses per day ? ?Exercise at least 150 minutes every week.   ?

## 2021-05-10 ENCOUNTER — Ambulatory Visit (INDEPENDENT_AMBULATORY_CARE_PROVIDER_SITE_OTHER): Payer: No Typology Code available for payment source | Admitting: Family Medicine

## 2021-05-10 ENCOUNTER — Encounter: Payer: Self-pay | Admitting: Family Medicine

## 2021-05-10 VITALS — BP 120/82 | HR 88 | Temp 98.3°F | Ht 63.0 in | Wt 170.5 lb

## 2021-05-10 DIAGNOSIS — L659 Nonscarring hair loss, unspecified: Secondary | ICD-10-CM

## 2021-05-10 DIAGNOSIS — Z8632 Personal history of gestational diabetes: Secondary | ICD-10-CM

## 2021-05-10 DIAGNOSIS — M79672 Pain in left foot: Secondary | ICD-10-CM

## 2021-05-10 DIAGNOSIS — E559 Vitamin D deficiency, unspecified: Secondary | ICD-10-CM | POA: Diagnosis not present

## 2021-05-10 DIAGNOSIS — E538 Deficiency of other specified B group vitamins: Secondary | ICD-10-CM

## 2021-05-10 DIAGNOSIS — Z0000 Encounter for general adult medical examination without abnormal findings: Secondary | ICD-10-CM | POA: Diagnosis not present

## 2021-05-10 DIAGNOSIS — E039 Hypothyroidism, unspecified: Secondary | ICD-10-CM | POA: Diagnosis not present

## 2021-05-10 DIAGNOSIS — R5383 Other fatigue: Secondary | ICD-10-CM

## 2021-05-10 NOTE — Progress Notes (Signed)
?Phone [phone] ?  ?Subjective:  ? ?Patient is a 36 y.o. female presenting for annual physical.    Here w/husband ? Wellness ?Hypothyroidism-out of meds for 2-3 mo(ran out and no refills).  Hair falls out a lot for 3 yrs.  Was having HA and seen last mo.  Doing better.   Had c/s 2019.  Does get tired quickly and naps ?B12/D deficiency-gummy vitamin only(multi) ? ?Chief Complaint  ?Patient presents with  ? Transfer of Care  ?  Not fasting ?  ? ? ?See problem oriented charting- ?ROS- ROS: ?Gen: no fever, chills  ?Skin: no rash, itching ?ENT: no ear pain, ear drainage, nasal congestion, rhinorrhea, sinus pressure, sore throat ?Eyes: no blurry vision, double vision ?Resp: no cough, wheeze,SOB ?CV: no CP, palpitations, LE edema,  ?GI: no heartburn, n/v/d/c, abd pain ?GU: no dysuria, urgency, frequency, hematuria.  Nocturia x1.  LMP-3/14.  Cycles about 25 days past 4-5 months.  Was 28.   Pap-prob 2019 after pregnancy ?MSK: no joint pain, myalgias, back pain.  L foot pain-laterally esp if sitting on it.  ?Neuro: no dizziness, headache, weakness, vertigo ?Psych: no depression, anxiety, insomnia, SI  ? ?The following were reviewed and entered/updated in epic: ?Past Medical History:  ?Diagnosis Date  ? Gestational diabetes   ? glyburide  ? Hypothyroidism   ? Ovarian cyst   ? ?Patient Active Problem List  ? Diagnosis Date Noted  ? History of gestational diabetes 05/10/2021  ? B12 deficiency 06/21/2019  ? Vitamin D deficiency 06/21/2019  ? Hypothyroidism (acquired) 06/20/2019  ? ?Past Surgical History:  ?Procedure Laterality Date  ? CESAREAN SECTION N/A 07/20/2017  ? Procedure: CESAREAN SECTION;  Surgeon: Maxie Better, MD;  [facility];  Service: Obstetrics;  Laterality: N/A;  ? NO PAST SURGERIES    ? ? ?Family History  ?Problem Relation Age of Onset  ? Diabetes Father   ? ? ?Medications- reviewed and updated ?Current Outpatient Medications  ?Medication Sig Dispense Refill  ? levothyroxine  (SYNTHROID) 125 MCG tablet Take 1 tablet (125 mcg total) by mouth daily before breakfast. 90 tablet 3  ? VITAMIN D PO Take by mouth daily.    ? ?No current facility-administered medications for this visit.  ? ? ?Allergies-reviewed and updated ?No Known Allergies ? ?Social History  ? ?Social History Narrative  ? homemaker  ? ?Objective  ?Objective:  ?BP 120/82   Pulse 88   Temp 98.3 ?F (36.8 ?C) (Temporal)   Ht 5\' 3"  (1.6 m)   Wt 170 lb 8 oz (77.3 kg)   LMP 04/20/2021 (Exact Date)   SpO2 98%   Breastfeeding Unknown   BMI 30.20 kg/m?  ?Physical Exam  ?Gen: WDWN NAD of ?HEENT: NCAT, conjunctiva not injected, sclera nonicteric ?TM WNL B, OP moist, no exudates .  Some poor dentition ?NECK:  supple, mild thyromegaly, no nodes, no carotid bruits ?CARDIAC: RRR, S1S2+, no murmur. DP 2+B ?LUNGS: CTAB. No wheezes ?ABDOMEN:  BS+, soft, NTND, No HSM, no masses ?EXT:  no edema.   ?MSK: MS 5/5 all 4.  L lateral foot-tenderness a lot 5th metatarsal.  ?NEURO: A&O x3.  CN II-XII intact.  ?PSYCH: normal mood. Good eye contact  ? ?  ?Assessment and Plan  ? ?Health Maintenance counseling: ?1. Anticipatory guidance: Patient counseled regarding regular dental exams q6 months, eye exams,  avoiding smoking and second hand smoke, limiting alcohol to 1 beverage per day, no illicit drugs.   ?2. Risk factor reduction:  Advised patient  of need for regular exercise and diet rich and fruits and vegetables to reduce risk of heart attack and stroke. Exercise- increase to 5d/wk.  ?Wt Readings from Last 3 Encounters:  ?05/10/21 170 lb 8 oz (77.3 kg)  ?04/15/21 171 lb (77.6 kg)  ?06/22/20 172 lb 6.4 oz (78.2 kg)  ? ?3. Immunizations/screenings/ancillary studies ?Immunization History  ?Administered Date(s) Administered  ? PFIZER(Purple Top)SARS-COV-2 Vaccination 05/17/2019, 06/07/2019, 01/25/2020  ? Tdap 06/18/2013  ? ?Health Maintenance Due  ?Topic Date Due  ? PAP SMEAR-Modifier  Never done  ?  ?4. Cervical cancer screening: will do next yr ?5.  Skin cancer screening- advised regular sunscreen use. Denies worrisome, changing, or new skin lesions.  ?6. Birth control/STD check: condoms ?7. Smoking associated screening: non smoker ?8. Alcohol screening: none ? ?Problem List Items Addressed This Visit   ? ?  ? Endocrine  ? Hypothyroidism (acquired)  ?  ? Other  ? B12 deficiency  ? Vitamin D deficiency  ? History of gestational diabetes  ? ?Other Visit Diagnoses   ? ? Wellness examination    -  Primary  ? Left foot pain      ? Relevant Orders  ? DG Foot Complete Left  ? Other fatigue      ? Alopecia      ? ?  ? Wellness-[facility] guidance.  Will do pap next yr.  Work on [employer].  Check labs ?Hypothyroidism-was out of meds 2-3 mo.  Back on for 3 wks,  too soon to check.  Return fasting 3 wks for labs ?Vitamin D deficiency-only on gummy vit.  Check vit D ?Vitamin B12 deficiency(vegetarian).  On multi vit.  Check B12 ?Fatigue/alopecia-check iron studies ?H/o gest DM-work on TLC.  At risk of DM-check A1C ?L foot pain-stretches.  Check x-ray ? ?Recommended follow up: annualReturn for 3 weeks for labs fasting.     1 yr annual w/pap. ?Future Appointments  ?Date Time Provider Department Center  ?06/01/2021  8:15 AM [facility]  ?05/12/2022 10:00 AM Jeani Sow, MD [facility]  ? ? ?Lab/Order associations:return fasting ?  ICD-10-CM   ?1. Wellness examination  Z00.00   ?  ?2. Hypothyroidism (acquired)  E03.9   ?  ?3. B12 deficiency  E53.8   ?  ?4. Vitamin D deficiency  E55.9   ?  ?5. Left foot pain  M79.672 DG Foot Complete Left  ?  ?6. History of gestational diabetes  Z75.32   ?  ?7. Other fatigue  R53.83   ?  ?8. Alopecia  L65.9   ?  ? ? ?No orders of the defined types were placed in this encounter. ? ?  ?Angelena Sole, MD ? ? ? ?

## 2021-05-10 NOTE — Patient Instructions (Addendum)
It was very nice to see you today! ? ?Work on exercise 5 days/week ?Track periods. ?Stop vitamin 3 days before labs ?[address] for x-ray-M-F 8:30-5.  Closed lunch 12:30-1 ? ? ?PLEASE NOTE: ? ?If you had any lab tests please let us know if you have not heard back within a few days. You may see your results on MyChart before we have a chance to review them but we will give you a call once they are reviewed by [place]. If we ordered any referrals today, please let us know if you have not heard from their office within the next week.  ? ?Please try these tips to maintain a healthy lifestyle: ? ?Eat most of your calories during the day when you are active. Eliminate processed foods including packaged sweets (pies, cakes, cookies), reduce intake of potatoes, white bread, white pasta, and white rice. Look for whole grain options, oat flour or almond flour. ? ?Each meal should contain half fruits/vegetables, one quarter protein, and one quarter carbs (no bigger than a computer mouse). ? ?Cut down on sweet beverages. This includes juice, soda, and sweet tea. Also watch fruit intake, though this is a healthier sweet option, it still contains natural sugar! Limit to 3 servings daily. ? ?Drink at least 1 glass of water with each meal and aim for at least 8 glasses per day ? ?Exercise at least 150 minutes every week.   ?

## 2021-06-01 ENCOUNTER — Other Ambulatory Visit (INDEPENDENT_AMBULATORY_CARE_PROVIDER_SITE_OTHER): Payer: No Typology Code available for payment source

## 2021-06-01 ENCOUNTER — Other Ambulatory Visit: Payer: Self-pay | Admitting: Physician Assistant

## 2021-06-01 DIAGNOSIS — E559 Vitamin D deficiency, unspecified: Secondary | ICD-10-CM

## 2021-06-01 DIAGNOSIS — Z8632 Personal history of gestational diabetes: Secondary | ICD-10-CM

## 2021-06-01 DIAGNOSIS — Z0000 Encounter for general adult medical examination without abnormal findings: Secondary | ICD-10-CM | POA: Diagnosis not present

## 2021-06-01 DIAGNOSIS — R5383 Other fatigue: Secondary | ICD-10-CM | POA: Diagnosis not present

## 2021-06-01 DIAGNOSIS — E538 Deficiency of other specified B group vitamins: Secondary | ICD-10-CM | POA: Diagnosis not present

## 2021-06-01 DIAGNOSIS — L659 Nonscarring hair loss, unspecified: Secondary | ICD-10-CM

## 2021-06-01 DIAGNOSIS — E039 Hypothyroidism, unspecified: Secondary | ICD-10-CM | POA: Diagnosis not present

## 2021-06-01 LAB — IBC + FERRITIN
Ferritin: 11.2 ng/mL (ref 10.0–291.0)
Iron: 73 ug/dL (ref 42–145)
Saturation Ratios: 17 % — ABNORMAL LOW (ref 20.0–50.0)
TIBC: 429.8 ug/dL (ref 250.0–450.0)
Transferrin: 307 mg/dL (ref 212.0–360.0)

## 2021-06-01 LAB — CBC WITH DIFFERENTIAL/PLATELET
Basophils Absolute: 0 10*3/uL (ref 0.0–0.1)
Basophils Relative: 0.8 % (ref 0.0–3.0)
Eosinophils Absolute: 0.4 10*3/uL (ref 0.0–0.7)
Eosinophils Relative: 6.5 % — ABNORMAL HIGH (ref 0.0–5.0)
HCT: 36.6 % (ref 36.0–46.0)
Hemoglobin: 12.1 g/dL (ref 12.0–15.0)
Lymphocytes Relative: 32.3 % (ref 12.0–46.0)
Lymphs Abs: 1.9 10*3/uL (ref 0.7–4.0)
MCHC: 33 g/dL (ref 30.0–36.0)
MCV: 79.7 fl (ref 78.0–100.0)
Monocytes Absolute: 0.4 10*3/uL (ref 0.1–1.0)
Monocytes Relative: 6.2 % (ref 3.0–12.0)
Neutro Abs: 3.2 10*3/uL (ref 1.4–7.7)
Neutrophils Relative %: 54.2 % (ref 43.0–77.0)
Platelets: 345 10*3/uL (ref 150.0–400.0)
RBC: 4.59 Mil/uL (ref 3.87–5.11)
RDW: 12.5 % (ref 11.5–15.5)
WBC: 5.9 10*3/uL (ref 4.0–10.5)

## 2021-06-01 LAB — HEMOGLOBIN A1C: Hgb A1c MFr Bld: 5.9 % (ref 4.6–6.5)

## 2021-06-01 LAB — COMPREHENSIVE METABOLIC PANEL
ALT: 11 U/L (ref 0–35)
AST: 12 U/L (ref 0–37)
Albumin: 4.3 g/dL (ref 3.5–5.2)
Alkaline Phosphatase: 83 U/L (ref 39–117)
BUN: 9 mg/dL (ref 6–23)
CO2: 26 mEq/L (ref 19–32)
Calcium: 9.3 mg/dL (ref 8.4–10.5)
Chloride: 103 mEq/L (ref 96–112)
Creatinine, Ser: 0.41 mg/dL (ref 0.40–1.20)
GFR: 125.12 mL/min (ref >60.00)
Glucose, Bld: 96 mg/dL (ref 70–99)
Potassium: 4.9 mEq/L (ref 3.5–5.1)
Sodium: 138 mEq/L (ref 135–145)
Total Bilirubin: 0.5 mg/dL (ref 0.2–1.2)
Total Protein: 6.9 g/dL (ref 6.0–8.3)

## 2021-06-01 LAB — TSH: TSH: 0.04 u[IU]/mL — ABNORMAL LOW (ref 0.35–5.50)

## 2021-06-01 LAB — VITAMIN B12: Vitamin B-12: 103 pg/mL — ABNORMAL LOW (ref 211–911)

## 2021-06-01 LAB — LIPID PANEL
Cholesterol: 197 mg/dL (ref 0–200)
HDL: 49.3 mg/dL (ref >39.00)
LDL Cholesterol: 125 mg/dL — ABNORMAL HIGH (ref 0–99)
NonHDL: 147.26
Total CHOL/HDL Ratio: 4
Triglycerides: 113 mg/dL (ref 0.0–149.0)
VLDL: 22.6 mg/dL (ref 0.0–40.0)

## 2021-06-01 LAB — VITAMIN D 25 HYDROXY (VIT D DEFICIENCY, FRACTURES): VITD: 16.62 ng/mL — ABNORMAL LOW (ref 30.00–100.00)

## 2021-06-01 MED ORDER — VITAMIN D (ERGOCALCIFEROL) 1.25 MG (50000 UNIT) PO CAPS: 50000.0000 [IU] | ORAL_CAPSULE | ORAL | 0 refills | Status: DC

## 2021-06-01 MED ORDER — LEVOTHYROXINE SODIUM 112 MCG PO TABS: 112.0000 ug | ORAL_TABLET | Freq: Every day | ORAL | 1 refills | Status: DC

## 2021-06-25 ENCOUNTER — Other Ambulatory Visit: Payer: Self-pay | Admitting: *Deleted

## 2021-06-25 MED ORDER — LEVOTHYROXINE SODIUM 112 MCG PO TABS: 112.0000 ug | ORAL_TABLET | Freq: Every day | ORAL | 0 refills | Status: DC

## 2021-11-01 ENCOUNTER — Encounter: Payer: Self-pay | Admitting: *Deleted

## 2022-01-20 ENCOUNTER — Encounter: Payer: Self-pay | Admitting: *Deleted

## 2022-03-12 ENCOUNTER — Other Ambulatory Visit: Payer: Self-pay | Admitting: Physician Assistant

## 2022-05-12 ENCOUNTER — Encounter: Payer: Self-pay | Admitting: Family Medicine

## 2022-05-12 ENCOUNTER — Other Ambulatory Visit (HOSPITAL_COMMUNITY)
Admission: RE | Admit: 2022-05-12 | Discharge: 2022-05-12 | Disposition: A | Discharge status: Home / Self Care | Payer: No Typology Code available for payment source | Source: Ambulatory Visit | Attending: Family Medicine | Admitting: Family Medicine

## 2022-05-12 ENCOUNTER — Ambulatory Visit: Payer: No Typology Code available for payment source | Admitting: Family Medicine

## 2022-05-12 VITALS — BP 112/80 | HR 78 | Temp 98.3°F | Ht 63.0 in | Wt 167.5 lb

## 2022-05-12 DIAGNOSIS — Z01419 Encounter for gynecological examination (general) (routine) without abnormal findings: Secondary | ICD-10-CM | POA: Diagnosis present

## 2022-05-12 DIAGNOSIS — E559 Vitamin D deficiency, unspecified: Secondary | ICD-10-CM | POA: Diagnosis not present

## 2022-05-12 DIAGNOSIS — Z124 Encounter for screening for malignant neoplasm of cervix: Secondary | ICD-10-CM

## 2022-05-12 DIAGNOSIS — Z0000 Encounter for general adult medical examination without abnormal findings: Secondary | ICD-10-CM

## 2022-05-12 DIAGNOSIS — E538 Deficiency of other specified B group vitamins: Secondary | ICD-10-CM | POA: Diagnosis not present

## 2022-05-12 DIAGNOSIS — E039 Hypothyroidism, unspecified: Secondary | ICD-10-CM | POA: Diagnosis not present

## 2022-05-12 LAB — CBC WITH DIFFERENTIAL/PLATELET
Basophils Absolute: 0.1 10*3/uL (ref 0.0–0.1)
Basophils Relative: 0.9 % (ref 0.0–3.0)
Eosinophils Absolute: 0.4 10*3/uL (ref 0.0–0.7)
Eosinophils Relative: 5.5 % — ABNORMAL HIGH (ref 0.0–5.0)
HCT: 36.1 % (ref 36.0–46.0)
Hemoglobin: 11.8 g/dL — ABNORMAL LOW (ref 12.0–15.0)
Lymphocytes Relative: 29 % (ref 12.0–46.0)
Lymphs Abs: 2 10*3/uL (ref 0.7–4.0)
MCHC: 32.6 g/dL (ref 30.0–36.0)
MCV: 77.9 fl — ABNORMAL LOW (ref 78.0–100.0)
Monocytes Absolute: 0.5 10*3/uL (ref 0.1–1.0)
Monocytes Relative: 6.6 % (ref 3.0–12.0)
Neutro Abs: 4.1 10*3/uL (ref 1.4–7.7)
Neutrophils Relative %: 58 % (ref 43.0–77.0)
Platelets: 292 10*3/uL (ref 150.0–400.0)
RBC: 4.63 Mil/uL (ref 3.87–5.11)
RDW: 14.2 % (ref 11.5–15.5)
WBC: 7 10*3/uL (ref 4.0–10.5)

## 2022-05-12 LAB — COMPREHENSIVE METABOLIC PANEL
ALT: 8 U/L (ref 0–35)
AST: 11 U/L (ref 0–37)
Albumin: 4.3 g/dL (ref 3.5–5.2)
Alkaline Phosphatase: 91 U/L (ref 39–117)
BUN: 6 mg/dL (ref 6–23)
CO2: 24 mEq/L (ref 19–32)
Calcium: 9 mg/dL (ref 8.4–10.5)
Chloride: 105 mEq/L (ref 96–112)
Creatinine, Ser: 0.39 mg/dL — ABNORMAL LOW (ref 0.40–1.20)
GFR: 125.8 mL/min (ref >60.00)
Glucose, Bld: 81 mg/dL (ref 70–99)
Potassium: 4 mEq/L (ref 3.5–5.1)
Sodium: 137 mEq/L (ref 135–145)
Total Bilirubin: 0.4 mg/dL (ref 0.2–1.2)
Total Protein: 7 g/dL (ref 6.0–8.3)

## 2022-05-12 LAB — LIPID PANEL
Cholesterol: 178 mg/dL (ref 0–200)
HDL: 49.4 mg/dL (ref >39.00)
LDL Cholesterol: 102 mg/dL — ABNORMAL HIGH (ref 0–99)
NonHDL: 128.83
Total CHOL/HDL Ratio: 4
Triglycerides: 135 mg/dL (ref 0.0–149.0)
VLDL: 27 mg/dL (ref 0.0–40.0)

## 2022-05-12 LAB — VITAMIN D 25 HYDROXY (VIT D DEFICIENCY, FRACTURES): VITD: 17.9 ng/mL — ABNORMAL LOW (ref 30.00–100.00)

## 2022-05-12 LAB — HEMOGLOBIN A1C: Hgb A1c MFr Bld: 5.9 % (ref 4.6–6.5)

## 2022-05-12 LAB — VITAMIN B12: Vitamin B-12: 129 pg/mL — ABNORMAL LOW (ref 211–911)

## 2022-05-12 LAB — TSH: TSH: 0.3 u[IU]/mL — ABNORMAL LOW (ref 0.35–5.50)

## 2022-05-12 MED ORDER — KETOCONAZOLE 2 % EX CREA: 1.0000 | TOPICAL_CREAM | Freq: Two times a day (BID) | CUTANEOUS | 0 refills | Status: DC

## 2022-05-12 NOTE — Patient Instructions (Addendum)
It was very nice to see you today!  Get exercise  See Dermatology  Take vitamin with iron  PLEASE NOTE:  If you had any lab tests please let us know if you have not heard back within a few days. You may see your results on MyChart before we have a chance to review them but we will give you a call once they are reviewed by [place]. If we ordered any referrals today, please let us know if you have not heard from their office within the next week.   Please try these tips to maintain a healthy lifestyle:  Eat most of your calories during the day when you are active. Eliminate processed foods including packaged sweets (pies, cakes, cookies), reduce intake of potatoes, white bread, white pasta, and white rice. Look for whole grain options, oat flour or almond flour.  Each meal should contain half fruits/vegetables, one quarter protein, and one quarter carbs (no bigger than a computer mouse).  Cut down on sweet beverages. This includes juice, soda, and sweet tea. Also watch fruit intake, though this is a healthier sweet option, it still contains natural sugar! Limit to 3 servings daily.  Drink at least 1 glass of water with each meal and aim for at least 8 glasses per day  Exercise at least 150 minutes every week.

## 2022-05-12 NOTE — Progress Notes (Signed)
Phone [phone]   Subjective:   Patient is a 37 y.o. female presenting for annual physical.    Chief Complaint  Patient presents with   Annual Exam    CPE Fasting    Annual-here w/husb Hypothyroidism-on 112.  No symptoms B12/D deficiency-not taking supps Red spots left breast-pain.  lotion  See problem oriented charting- ROS- ROS: Gen: no fever, chills  Skin: hair still falling out a lot. ENT: no ear pain, ear drainage, nasal congestion, rhinorrhea, sinus pressure, sore throat Eyes: no blurry vision, double vision Resp: no cough, wheeze,SOB CV: no CP, palpitations, LE edema,  GI: no heartburn, n/v/d/c, abd pain GU: no dysuria, urgency, frequency, hematuria. 3-4 days heavy bleeding.  25 days cycles MSK: no joint pain, myalgias, back pain Neuro: no dizziness, headache, weakness, vertigo Psych: no depression, anxiety, insomnia, SI   The following were reviewed and entered/updated in epic: Past Medical History:  Diagnosis Date   Gestational diabetes    glyburide   Hypothyroidism    Ovarian cyst    Patient Active Problem List   Diagnosis Date Noted   History of gestational diabetes 05/10/2021   B12 deficiency 06/21/2019   Vitamin D deficiency 06/21/2019   Hypothyroidism (acquired) 06/20/2019   Past Surgical History:  Procedure Laterality Date   CESAREAN SECTION N/A 07/20/2017   Procedure: CESAREAN SECTION;  Surgeon: Servando Salina, MD;  Location: [facility];  Service: Obstetrics;  Laterality: N/A;   NO PAST SURGERIES      Family History  Problem Relation Age of Onset   Diabetes Father     Medications- reviewed and updated Current Outpatient Medications  Medication Sig Dispense Refill   levothyroxine (SYNTHROID) 112 MCG tablet TAKE 1 TABLET BY MOUTH DAILY BEFORE BREAKFAST. (Patient taking differently: Take 125 mcg by mouth daily before breakfast.) 90 tablet 0   VITAMIN D PO Take by mouth daily. (Patient not taking: Reported on 05/12/2022)      Vitamin D, Ergocalciferol, (DRISDOL) 1.25 MG (50000 UNIT) CAPS capsule Take 1 capsule (50,000 Units total) by mouth every 7 (seven) days. (Patient not taking: Reported on 05/12/2022) 12 capsule 0   No current facility-administered medications for this visit.    Allergies-reviewed and updated No Known Allergies  Social History   Social History Narrative   homemaker   Objective  Objective:  BP 112/80   Pulse 78   Temp 98.3 F (36.8 C) (Temporal)   Ht 5\' 3"  (1.6 m)   Wt 167 lb 8 oz (76 kg)   LMP 04/14/2022 (Exact Date)   SpO2 99%   Breastfeeding No   BMI 29.67 kg/m  Physical Exam  Gen: WDWN NAD HEENT: NCAT, conjunctiva not injected, sclera nonicteric TM WNL B, OP moist, no exudates  NECK:  supple, no thyromegaly, no nodes, no carotid bruits CARDIAC: RRR, S1S2+, no murmur. DP 2+B LUNGS: CTAB. No wheezes ABDOMEN:  BS+, soft, NTND, No HSM, no masses EXT:  no edema MSK: no gross abnormalities. MS 5/5 all 4 NEURO: A&O x3.  CN II-XII intact.  PSYCH: normal mood. Good eye contact   Breasts: Examined lying and sitting.              Right:   Without masses, retractions,  nipple discharge or axillary adenopathy.               Left:     Without masses, retractions, nipple discharge or axillary adenopathy. Under left breast-hyperpigmented rash and raw line and junction Genitourinary  Inguinal/mons:  Normal without inguinal adenopathy             External genitalia:  Normal appearing vulva with no masses, tenderness, or lesions             BUS/Urethra/Skene's glands:  Normal             Vagina:  Normal appearing with normal color and discharge, no lesions             Cervix:  Normal appearing without discharge or lesions             Uterus:  Normal in size, shape and contour.  Midline and mobile, nontender             Adnexa/parametria:                           Rt:        Normal in size, without masses or tenderness.                         Lt:        Normal in size,  without masses or tenderness.             Anus and perineum: Normal  Chaperone present QJ     Assessment and Plan   Health Maintenance counseling: 1. Anticipatory guidance: Patient counseled regarding regular dental exams q6 months, eye exams,  avoiding smoking and second hand smoke, limiting alcohol to 1 beverage per day, no illicit drugs.   2. Risk factor reduction:  Advised patient of need for regular exercise and diet rich and fruits and vegetables to reduce risk of heart attack and stroke. Exercise- encouraged..  Wt Readings from Last 3 Encounters:  05/12/22 167 lb 8 oz (76 kg)  05/10/21 170 lb 8 oz (77.3 kg)  04/15/21 171 lb (77.6 kg)   3. Immunizations/screenings/ancillary studies Immunization History  Administered Date(s) Administered   PFIZER(Purple Top)SARS-COV-2 Vaccination 05/17/2019, 06/07/2019, 01/25/2020   Tdap 06/18/2013   Health Maintenance Due  Topic Date Due   PAP SMEAR-Modifier  Never done    4. Cervical cancer screening: today 5. Skin cancer screening- advised regular sunscreen use. Denies worrisome, changing, or new skin lesions.  6. Birth control/STD check: condoms 7. Smoking associated screening: non smoker 8. Alcohol screening: non  Problem List Items Addressed This Visit       Endocrine   Hypothyroidism (acquired)     Other   B12 deficiency   Relevant Orders   Vitamin B12   Vitamin D deficiency   Relevant Orders   VITAMIN D 25 Hydroxy (Vit-D Deficiency, Fractures)   Other Visit Diagnoses     Wellness examination    -  Primary   Relevant Orders   Comprehensive metabolic panel   Hemoglobin A1c   Lipid panel   TSH   Vitamin B12   VITAMIN D 25 Hydroxy (Vit-D Deficiency, Fractures)   CBC with Differential/Platelet   Well woman exam       Relevant Orders   Cytology - PAP( [place])   Screening for cervical cancer       Relevant Orders   Cytology - PAP( )      Wellness-anticipatory guidance.  Work on [employer].  Check  CBC,CMP,lipids,TSH, A1C.  F/u 1 yr  Hypothyroidism-chronic.  ? Control-decreased to 112 last year(s)-check TSH Well woman-check pap B12/D deficiency-not on any supps-take MVI at least. Check B12/D  Probable yeast under left breast-ketoconazole cream.  Keep dry.  If no change, follow up derm  Recommended follow up: annualReturn in about 1 year (around 05/12/2023) for annual. No future appointments.  Lab/Order associations:+ fasting x coffe 2 hrs ago(milk and sugar).    ICD-10-CM   1. Wellness examination  Z00.00 Comprehensive metabolic panel    Hemoglobin A1c    Lipid panel    TSH    Vitamin B12    VITAMIN D 25 Hydroxy (Vit-D Deficiency, Fractures)    CBC with Differential/Platelet    2. Hypothyroidism (acquired)  E03.9     3. B12 deficiency  E53.8 Vitamin B12    4. Vitamin D deficiency  E55.9 VITAMIN D 25 Hydroxy (Vit-D Deficiency, Fractures)    5. Well woman exam  Z01.419 Cytology - PAP( [place])    6. Screening for cervical cancer  Z12.4 Cytology - PAP( [place])      No orders of the defined types were placed in this encounter.    Wellington Hampshire, MD

## 2022-05-13 LAB — CYTOLOGY - PAP
Comment: NEGATIVE
Diagnosis: NEGATIVE
High risk HPV: NEGATIVE

## 2022-05-15 NOTE — Progress Notes (Signed)
1.  Pap is normal-repeat in 3 years 2.  A lot of lab abnormalities-please schedule appointment to discuss plan and follow-up labs

## 2022-05-16 ENCOUNTER — Telehealth: Payer: Self-pay | Admitting: Family Medicine

## 2022-05-16 ENCOUNTER — Ambulatory Visit (INDEPENDENT_AMBULATORY_CARE_PROVIDER_SITE_OTHER): Payer: No Typology Code available for payment source | Admitting: Family Medicine

## 2022-05-16 ENCOUNTER — Encounter: Payer: Self-pay | Admitting: Family Medicine

## 2022-05-16 VITALS — BP 118/88 | HR 94 | Temp 98.6°F | Ht 63.0 in | Wt 168.0 lb

## 2022-05-16 DIAGNOSIS — E039 Hypothyroidism, unspecified: Secondary | ICD-10-CM

## 2022-05-16 DIAGNOSIS — E559 Vitamin D deficiency, unspecified: Secondary | ICD-10-CM

## 2022-05-16 DIAGNOSIS — E538 Deficiency of other specified B group vitamins: Secondary | ICD-10-CM

## 2022-05-16 DIAGNOSIS — R7303 Prediabetes: Secondary | ICD-10-CM

## 2022-05-16 DIAGNOSIS — M79641 Pain in right hand: Secondary | ICD-10-CM

## 2022-05-16 DIAGNOSIS — D500 Iron deficiency anemia secondary to blood loss (chronic): Secondary | ICD-10-CM

## 2022-05-16 MED ORDER — LEVOTHYROXINE SODIUM 100 MCG PO TABS: 100.0000 ug | ORAL_TABLET | Freq: Every day | ORAL | 3 refills | Status: DC

## 2022-05-16 MED ORDER — VITAMIN D (ERGOCALCIFEROL) 1.25 MG (50000 UNIT) PO CAPS: 50000.0000 [IU] | ORAL_CAPSULE | ORAL | 0 refills | Status: DC

## 2022-05-16 NOTE — Telephone Encounter (Signed)
Noted  

## 2022-05-16 NOTE — Telephone Encounter (Signed)
LVM informing patient that pcp would like her to have an OV or VV to discuss future treatment plans.

## 2022-05-16 NOTE — Progress Notes (Signed)
Subjective:     Patient ID: Heidi Barton, female    DOB: 1986-01-01, 37 y.o.   MRN: [id number]  Chief Complaint  Patient presents with   Follow-up    Follow-up to discuss recent blood work and treatment options     HPI-here w/husband Discuss labs and plan Vitamin D-not on supps.   B12-no on supps vegetarian PreDM-had gestational diabetes mellitus Hypothyroidism-on 112.   Fell yesterday-tripped over wire at party and fell onto right hand.  Getting better.   There are no preventive care reminders to display for this patient.  Past Medical History:  Diagnosis Date   Gestational diabetes    glyburide   Hypothyroidism    Ovarian cyst     Past Surgical History:  Procedure Laterality Date   CESAREAN SECTION N/A 07/20/2017   Procedure: CESAREAN SECTION;  Surgeon: Maxie Better, MD;  [facility]: [facility];  Service: Obstetrics;  Laterality: N/A;   NO PAST SURGERIES      Outpatient Medications Prior to Visit  Medication Sig Dispense Refill   ketoconazole (NIZORAL) 2 % cream Apply 1 Application topically 2 (two) times daily. 60 g 0   levothyroxine (SYNTHROID) 112 MCG tablet TAKE 1 TABLET BY MOUTH DAILY BEFORE BREAKFAST. (Patient taking differently: Take 125 mcg by mouth daily before breakfast.) 90 tablet 0   VITAMIN D PO Take by mouth daily. (Patient not taking: Reported on 05/12/2022)     Vitamin D, Ergocalciferol, (DRISDOL) 1.25 MG (50000 UNIT) CAPS capsule Take 1 capsule (50,000 Units total) by mouth every 7 (seven) days. (Patient not taking: Reported on 05/12/2022) 12 capsule 0   No facility-administered medications prior to visit.    No Known Allergies ROS neg/noncontributory except as noted HPI/below      Objective:     BP 118/88 (BP Location: Left Arm, Patient Position: Sitting, Cuff Size: Large)   Pulse 94   Temp 98.6 F (37 C) (Temporal)   Ht 5\' 3"  (1.6 m)   Wt 168 lb (76.2 kg)   LMP 04/14/2022 (Exact Date)   SpO2 99%   BMI 29.76 kg/m  Wt  Readings from Last 3 Encounters:  05/16/22 168 lb (76.2 kg)  05/12/22 167 lb 8 oz (76 kg)  05/10/21 170 lb 8 oz (77.3 kg)    Physical Exam   Gen: WDWN NAD HEENT: NCAT, conjunctiva not injected, sclera nonicteric MSK: no gross abnormalities.  Pain to palpation and small lump right lateral 5th metacarpal NEURO: A&O x3.  CN II-XII intact.  PSYCH: normal mood. Good eye contact  Reviewed labs w/pt    Assessment & Plan:   Problem List Items Addressed This Visit       Endocrine   Hypothyroidism (acquired)   Relevant Medications   levothyroxine (SYNTHROID) 100 MCG tablet   Other Relevant Orders   TSH     Other   B12 deficiency   Relevant Orders   Vitamin B12   Vitamin D deficiency   Other Visit Diagnoses     Pain in right hand    -  Primary   Relevant Orders   DG Hand 2 View Right   Iron deficiency anemia due to chronic blood loss       Relevant Orders   IBC + Ferritin   CBC with Differential/Platelet   Prediabetes          B12 deficiency-patient not on supps.  Patient vegetarian-take B12 daily-repeat 2 month(s) for absorption Vitamin D deficiency-not on supps.  Take 38329 weekly  x 12, then 2000iu/D.  Reck 6 month(s) Iron deficiency anemia-menses not very heavy.  Take MVI-check CBC,iron studies 2-3 month(s) Hypothyroidism-chronic.  Overcorrected-decrease to 100 and reck TSH 2-3 month(s). PreDM w/history of gestational diabetes mellitus-work on diet/exercise Pain right hand after fall-. Check x-ray as pain to palpation and lump  Meds ordered this encounter  Medications   Vitamin D, Ergocalciferol, (DRISDOL) 1.25 MG (50000 UNIT) CAPS capsule    Sig: Take 1 capsule (50,000 Units total) by mouth every 7 (seven) days.    Dispense:  12 capsule    Refill:  0   levothyroxine (SYNTHROID) 100 MCG tablet    Sig: Take 1 tablet (100 mcg total) by mouth daily.    Dispense:  90 tablet    Refill:  3    Angelena Sole, MD

## 2022-05-16 NOTE — Patient Instructions (Addendum)
Vitamin D 50,000 IU/week(s)(prescription) for 12 weeks, then 2000 IU/day(over the counter) B12 1000 mcg/day Multivitamin with iron  get X-ray/labs at [facility]  [address]  hours 8=M-F 8:30-5.  closed 12:30-1 lunch

## 2022-05-25 ENCOUNTER — Ambulatory Visit
Admission: RE | Admit: 2022-05-25 | Discharge: 2022-05-25 | Disposition: A | Discharge status: Home / Self Care | Payer: No Typology Code available for payment source | Source: Ambulatory Visit | Attending: Family Medicine | Admitting: Family Medicine

## 2022-05-25 DIAGNOSIS — M79641 Pain in right hand: Secondary | ICD-10-CM

## 2022-05-26 ENCOUNTER — Encounter: Payer: Self-pay | Admitting: Family Medicine

## 2022-06-14 ENCOUNTER — Other Ambulatory Visit: Payer: Self-pay | Admitting: Physician Assistant

## 2022-08-01 ENCOUNTER — Other Ambulatory Visit: Payer: Self-pay | Admitting: Family Medicine

## 2022-08-17 ENCOUNTER — Other Ambulatory Visit: Payer: No Typology Code available for payment source

## 2022-08-25 ENCOUNTER — Ambulatory Visit: Payer: No Typology Code available for payment source | Admitting: Family

## 2022-11-15 ENCOUNTER — Ambulatory Visit: Payer: No Typology Code available for payment source | Admitting: Family Medicine

## 2022-11-16 ENCOUNTER — Telehealth: Payer: Self-pay | Admitting: Family Medicine

## 2022-11-16 ENCOUNTER — Ambulatory Visit: Payer: No Typology Code available for payment source | Admitting: Family Medicine

## 2022-11-16 NOTE — Telephone Encounter (Signed)
Called pt to get rescheduled following no show on 10/9. Patient stated she would call back for rescheduling.

## 2023-05-13 ENCOUNTER — Other Ambulatory Visit: Payer: Self-pay | Admitting: Family Medicine

## 2023-05-15 ENCOUNTER — Encounter: Payer: No Typology Code available for payment source | Admitting: Family Medicine

## 2023-05-15 NOTE — Addendum Note (Signed)
 Addended by: Jobe Gibbon on: 05/15/2023 09:29 AM   Modules accepted: Orders

## 2023-07-12 ENCOUNTER — Encounter: Payer: Self-pay | Admitting: Family Medicine

## 2023-07-12 ENCOUNTER — Ambulatory Visit (INDEPENDENT_AMBULATORY_CARE_PROVIDER_SITE_OTHER): Admitting: Family Medicine

## 2023-07-12 VITALS — BP 130/88 | HR 75 | Temp 98.2°F | Resp 18 | Ht 63.0 in | Wt 171.2 lb

## 2023-07-12 DIAGNOSIS — Z0000 Encounter for general adult medical examination without abnormal findings: Secondary | ICD-10-CM

## 2023-07-12 DIAGNOSIS — E559 Vitamin D deficiency, unspecified: Secondary | ICD-10-CM

## 2023-07-12 DIAGNOSIS — E039 Hypothyroidism, unspecified: Secondary | ICD-10-CM

## 2023-07-12 DIAGNOSIS — Z13220 Encounter for screening for lipoid disorders: Secondary | ICD-10-CM

## 2023-07-12 DIAGNOSIS — Z131 Encounter for screening for diabetes mellitus: Secondary | ICD-10-CM

## 2023-07-12 DIAGNOSIS — E538 Deficiency of other specified B group vitamins: Secondary | ICD-10-CM

## 2023-07-12 DIAGNOSIS — L659 Nonscarring hair loss, unspecified: Secondary | ICD-10-CM

## 2023-07-12 LAB — CBC WITH DIFFERENTIAL/PLATELET
Basophils Absolute: 0.1 10*3/uL (ref 0.0–0.1)
Basophils Relative: 0.9 % (ref 0.0–3.0)
Eosinophils Absolute: 0.4 10*3/uL (ref 0.0–0.7)
Eosinophils Relative: 4.4 % (ref 0.0–5.0)
HCT: 36 % (ref 36.0–46.0)
Hemoglobin: 11.7 g/dL — ABNORMAL LOW (ref 12.0–15.0)
Lymphocytes Relative: 23.9 % (ref 12.0–46.0)
Lymphs Abs: 2.1 10*3/uL (ref 0.7–4.0)
MCHC: 32.5 g/dL (ref 30.0–36.0)
MCV: 75.9 fl — ABNORMAL LOW (ref 78.0–100.0)
Monocytes Absolute: 0.5 10*3/uL (ref 0.1–1.0)
Monocytes Relative: 5.1 % (ref 3.0–12.0)
Neutro Abs: 5.8 10*3/uL (ref 1.4–7.7)
Neutrophils Relative %: 65.7 % (ref 43.0–77.0)
Platelets: 409 10*3/uL — ABNORMAL HIGH (ref 150.0–400.0)
RBC: 4.75 Mil/uL (ref 3.87–5.11)
RDW: 14.8 % (ref 11.5–15.5)
WBC: 8.8 10*3/uL (ref 4.0–10.5)

## 2023-07-12 LAB — VITAMIN D 25 HYDROXY (VIT D DEFICIENCY, FRACTURES): VITD: 13.48 ng/mL — ABNORMAL LOW (ref 30.00–100.00)

## 2023-07-12 LAB — LIPID PANEL
Cholesterol: 196 mg/dL (ref 0–200)
HDL: 50.9 mg/dL (ref >39.00)
LDL Cholesterol: 113 mg/dL — ABNORMAL HIGH (ref 0–99)
NonHDL: 145.42
Total CHOL/HDL Ratio: 4
Triglycerides: 161 mg/dL — ABNORMAL HIGH (ref 0.0–149.0)
VLDL: 32.2 mg/dL (ref 0.0–40.0)

## 2023-07-12 LAB — IBC + FERRITIN
Ferritin: 5.8 ng/mL — ABNORMAL LOW (ref 10.0–291.0)
Iron: 40 ug/dL — ABNORMAL LOW (ref 42–145)
Saturation Ratios: 7.3 % — ABNORMAL LOW (ref 20.0–50.0)
TIBC: 548.8 ug/dL — ABNORMAL HIGH (ref 250.0–450.0)
Transferrin: 392 mg/dL — ABNORMAL HIGH (ref 212.0–360.0)

## 2023-07-12 LAB — VITAMIN B12: Vitamin B-12: 728 pg/mL (ref 211–911)

## 2023-07-12 LAB — COMPREHENSIVE METABOLIC PANEL WITH GFR
ALT: 11 U/L (ref 0–35)
AST: 14 U/L (ref 0–37)
Albumin: 4.4 g/dL (ref 3.5–5.2)
Alkaline Phosphatase: 88 U/L (ref 39–117)
BUN: 7 mg/dL (ref 6–23)
CO2: 26 meq/L (ref 19–32)
Calcium: 9.5 mg/dL (ref 8.4–10.5)
Chloride: 103 meq/L (ref 96–112)
Creatinine, Ser: 0.41 mg/dL (ref 0.40–1.20)
GFR: 123.28 mL/min (ref >60.00)
Glucose, Bld: 80 mg/dL (ref 70–99)
Potassium: 4.3 meq/L (ref 3.5–5.1)
Sodium: 137 meq/L (ref 135–145)
Total Bilirubin: 0.3 mg/dL (ref 0.2–1.2)
Total Protein: 7.3 g/dL (ref 6.0–8.3)

## 2023-07-12 LAB — HEMOGLOBIN A1C: Hgb A1c MFr Bld: 5.9 % (ref 4.6–6.5)

## 2023-07-12 LAB — TSH: TSH: 0.92 u[IU]/mL (ref 0.35–5.50)

## 2023-07-12 NOTE — Progress Notes (Signed)
 Phone [phone]   Subjective:   Patient is a 38 y.o. female presenting for annual physical.    Chief Complaint  Patient presents with   Annual Exam    CPE Fasting, had coffee only    Annual-no exercise Multivit Discussed the use of AI scribe software for clinical note transcription with the patient, who gave verbal consent to proceed.  History of Present Illness Heidi Barton is a 38 year old female who presents for an annual physical exam. She is accompanied by her husband.  She has a history of hypothyroidism and is currently taking levothyroxine  100 mcg daily. No symptoms of hyperthyroidism or hypothyroidism such as heart palpitations or shakiness are reported. She experiences hair loss, which may be related to her thyroid condition.  Her menstrual cycles occur approximately every 25 days, which is shorter than her previous cycle length, and this has been ongoing for more than a year. Her last cycle started on May 10, and she began her current cycle today, June 4. She also describes a 'whitening' discharge when urinating, which she has noticed for the past six to eight months, but denies any burning or itching.  She follows a vegetarian diet and takes a multivitamin, which may include vitamin D  and B12. There is a discussion about the adequacy of B12 intake from her diet and multivitamin. She rarely exercises or walks, attributing it to feeling 'lazy' and having 'some work to do'.  No coughing, wheezing, shortness of breath, vomiting, diarrhea, constipation, muscle aches, joint pains, or feelings of depression or suicidal thoughts.    See problem oriented charting- ROS- ROS: Gen: no fever, chills  Skin: no rash, itching ENT: no ear pain, ear drainage, nasal congestion, rhinorrhea, sinus pressure, sore throat Eyes: no blurry vision, double vision Resp: no cough, wheeze,SOB CV: no CP, palpitations, LE edema,  GI: no heartburn, n/v/d/c, abd pain GU: no dysuria, urgency,  frequency, hematuria MSK: no joint pain, myalgias, back pain Neuro: no dizziness, headache, weakness, vertigo Psych: no depression, anxiety, insomnia, SI   The following were reviewed and entered/updated in epic: Past Medical History:  Diagnosis Date   Gestational diabetes    glyburide   Hypothyroidism    Ovarian cyst    Patient Active Problem List   Diagnosis Date Noted   History of gestational diabetes 05/10/2021   B12 deficiency 06/21/2019   Vitamin D  deficiency 06/21/2019   Hypothyroidism (acquired) 06/20/2019   Past Surgical History:  Procedure Laterality Date   CESAREAN SECTION N/A 07/20/2017   Procedure: CESAREAN SECTION;  Surgeon: Ivery Marking, MD;  [facility]: [facility];  Service: Obstetrics;  Laterality: N/A;    Family History  Problem Relation Age of Onset   Diabetes Father     Medications- reviewed and updated Current Outpatient Medications  Medication Sig Dispense Refill   ketoconazole  (NIZORAL ) 2 % cream Apply 1 Application topically 2 (two) times daily. 60 g 0   levothyroxine  (SYNTHROID ) 100 MCG tablet TAKE 1 TABLET BY MOUTH EVERY DAY 90 tablet 3   No current facility-administered medications for this visit.    Allergies-reviewed and updated No Known Allergies  Social History   Social History Narrative   homemaker   Objective  Objective:  BP 130/88 (BP Location: Left Arm, Patient Position: Sitting, Cuff Size: Large)   Pulse 75   Temp 98.2 F (36.8 C) (Temporal)   Resp 18   Ht 5\' 3"  (1.6 m)   Wt 171 lb 4 oz (77.7 kg)   LMP 07/12/2023 (  Exact Date)   SpO2 98%   BMI 30.34 kg/m  Physical Exam  Gen: WDWN NAD HEENT: NCAT, conjunctiva not injected, sclera nonicteric TM WNL B, OP moist, no exudates  NECK:  supple, + thyromegaly, no nodes, no carotid bruits CARDIAC: RRR, S1S2+, no murmur. DP 2+B LUNGS: CTAB. No wheezes ABDOMEN:  BS+, soft, tender lower abd(menses), No HSM, no masses EXT:  no edema MSK: no gross abnormalities. MS  5/5 all 4 NEURO: A&O x3.  CN II-XII intact.  PSYCH: normal mood. Good eye contact     Assessment and Plan   Health Maintenance counseling: 1. Anticipatory guidance: Patient counseled regarding regular dental exams q6 months, eye exams,  avoiding smoking and second hand smoke, limiting alcohol to 1 beverage per day, no illicit drugs.   2. Risk factor reduction:  Advised patient of need for regular exercise and diet rich and fruits and vegetables to reduce risk of heart attack and stroke. Exercise- encouraged.  Wt Readings from Last 3 Encounters:  07/12/23 171 lb 4 oz (77.7 kg)  05/16/22 168 lb (76.2 kg)  05/12/22 167 lb 8 oz (76 kg)   3. Immunizations/screenings/ancillary studies Immunization History  Administered Date(s) Administered   PFIZER(Purple Top)SARS-COV-2 Vaccination 05/17/2019, 06/07/2019, 01/25/2020   Tdap 06/18/2013   There are no preventive care reminders to display for this patient.  4. Cervical cancer screening- utd 5. Breast cancer screening-  mammogram gave list to sch 6. Colon cancer screening - n/a 7. Skin cancer screening- advised regular sunscreen use. Denies worrisome, changing, or new skin lesions.  8. Birth control/STD check- condom 9. Osteoporosis screening- n/a 10. Smoking associated screening - non smoker  Wellness examination -     CBC with Differential/Platelet -     Comprehensive metabolic panel with GFR -     Lipid panel -     TSH -     Hemoglobin A1c -     IBC + Ferritin -     Vitamin B12 -     VITAMIN D  25 Hydroxy (Vit-D Deficiency, Fractures)  Hypothyroidism (acquired) -     TSH  B12 deficiency -     Vitamin B12  Vitamin D  deficiency -     VITAMIN D  25 Hydroxy (Vit-D Deficiency, Fractures)  Hair loss -     CBC with Differential/Platelet -     IBC + Ferritin   Wellness-anticipatory guidance.  Work on Diet/Exercise  Check CBC,CMP,lipids,TSH, A1C.  F/u 1 yr  Assessment and Plan Assessment & Plan Menstrual Irregularities   Her  menstrual cycle occurs every 25-26 days, shorter than previous cycles, ongoing for over a year. Possible perimenopause discussed as a cause. She should track cycle days from the start of bleeding to the start of the next cycle to assess consistency. Cycle length variability due to aging and perimenopausal changes was explained.  Vaginal Discharge   She experiences white mucus-like discharge post-urination for 6-8 months, without itching or burning. Differential includes normal physiological discharge or yeast infection. Urinalysis and further examination are needed to rule out infection but deferred as on menses today. A follow-up pelvic exam and vaginal swabs may be necessary if urinalysis is negative.  Hypothyroidism   Chronic hypothyroidism is managed with levothyroxine  100 mcg daily. She shows no symptoms of hyperthyroidism or hypothyroidism. Lifelong treatment is necessary due to a non-functioning thyroid. The importance of medication adherence and potential consequences of improper dosing, including heart palpitations, hair loss, diarrhea, fatigue, and fluid retention, were discussed. No long-term side effects  are expected from the medication. Risks of missing doses, such as metabolic slowdown and fluid retention, were explained.  General Health Maintenance   Increased physical activity is encouraged to prevent diabetes, especially with a history of gestational diabetes. The importance of activity for overall health was discussed. A vegetarian diet is noted, with potential for insufficient B12 intake. She is currently taking a multivitamin; B12 and vitamin D  levels should be monitored. B12 and vitamin D  storage in the body were explained, but levels may drop without consistent supplementation.  Elevated blood pressure-needs to decrease salt, work on exercise.  Reck 3 mo.   Follow-up   Plans include further evaluation of vaginal discharge and menstrual irregularities. A follow-up visit is  scheduled in two weeks for a pelvic exam and further evaluation of vaginal discharge.    Recommended follow up: Return in about 2 weeks (around 07/26/2023) for vaginal d/c.    3 months for bp.  Lab/Order associations:+ fasting  Ellsworth Haas, MD

## 2023-07-12 NOTE — Patient Instructions (Addendum)
 It was very nice to see you today!  Decrease salt and start walking   PLEASE NOTE:  If you had any lab tests please let us  know if you have not heard back within a few days. You may see your results on MyChart before we have a chance to review them but we will give you a call once they are reviewed by us . If we ordered any referrals today, please let us  know if you have not heard from their office within the next week.   Please try these tips to maintain a healthy lifestyle:  Eat most of your calories during the day when you are active. Eliminate processed foods including packaged sweets (pies, cakes, cookies), reduce intake of potatoes, white bread, white pasta, and white rice. Look for whole grain options, oat flour or almond flour.  Each meal should contain half fruits/vegetables, one quarter protein, and one quarter carbs (no bigger than a computer mouse).  Cut down on sweet beverages. This includes juice, soda, and sweet tea. Also watch fruit intake, though this is a healthier sweet option, it still contains natural sugar! Limit to 3 servings daily.  Drink at least 1 glass of water with each meal and aim for at least 8 glasses per day  Exercise at least 150 minutes every week.

## 2023-07-13 ENCOUNTER — Ambulatory Visit: Payer: Self-pay | Admitting: Family Medicine

## 2023-07-13 ENCOUNTER — Other Ambulatory Visit: Payer: Self-pay | Admitting: *Deleted

## 2023-07-13 MED ORDER — VITAMIN D (ERGOCALCIFEROL) 1.25 MG (50000 UNIT) PO CAPS: 50000.0000 [IU] | ORAL_CAPSULE | ORAL | 0 refills | Status: DC

## 2023-07-13 NOTE — Progress Notes (Signed)
 1.  A1C(3 month average of sugars) is elevated.  This is considered PreDiabetes.  Work on diet-decrease sugars and starches and aim for 30 minutes of exercise 5 days/week to prevent progression to diabetes  2.  Mild anemia and iron  is very low-could be contributing to hair loss.  In addition to her daily vitamin, needs to take iron  325mg  daily and may need to take colace 100mg  for constipation 3.  Vitamin D  low-needs to take 50k weekly-#12.    Will discuss more at appt in 2 wks

## 2023-07-27 ENCOUNTER — Ambulatory Visit: Admitting: Family Medicine

## 2023-10-12 ENCOUNTER — Ambulatory Visit: Admitting: Family Medicine

## 2023-10-15 ENCOUNTER — Other Ambulatory Visit: Payer: Self-pay | Admitting: Family Medicine

## 2024-01-18 ENCOUNTER — Other Ambulatory Visit: Payer: Self-pay | Admitting: Family Medicine

## 2024-01-18 ENCOUNTER — Encounter: Payer: Self-pay | Admitting: Family Medicine

## 2024-01-24 ENCOUNTER — Ambulatory Visit: Payer: Self-pay

## 2024-01-24 NOTE — Telephone Encounter (Signed)
 FYI Only or Action Required?: FYI only for provider: appointment scheduled on 01/25/24.  Patient was last seen in primary care on 07/12/2023 by Wendolyn Jenkins Jansky, MD.  Called Nurse Triage reporting Rash.  Symptoms began a week ago.  Interventions attempted: ketoconazole  (NIZORAL ) 2 % cream .  Symptoms are: unchanged.  Triage Disposition: See Physician Within 24 Hours  Patient/caregiver understands and will follow disposition?: Yes  Copied from CRM #[id number]. Topic: Clinical - Red Word Triage >> Jan 24, 2024  8:29 AM Heidi Barton wrote: Reason for RMF:Mjdy all over her body and mild pain  Under breast, redness, wet Painful itching Reason for Disposition  [1] Looks infected (e.g., spreading redness, pus) AND [2] no fever  Answer Assessment - Initial Assessment Questions Scheduled 01/25/24  Advised call back if symptoms worsen.  1. APPEARANCE of RASH: What does the rash look like? (e.g., blisters, dry flaky skin, red spots, redness, sores)     Redness, wet, denies diff breathing, purulent drainage, bleeding 2. LOCATION: Where is the rash located?      [place] breast 5. ONSET: When did the rash start?      Week ago 6. ITCHING: Does the rash itch? If Yes, ask: How bad is the itch?  (Scale 0-10; or none, mild, moderate, severe)     itching 7. PAIN: Does the rash hurt? If Yes, ask: How bad is the pain?  (Scale 0-10; or none, mild, moderate, severe)     4/10 8. OTHER SYMPTOMS: Do you have any other symptoms? (e.g., fever)     Cream not improve  Protocols used: Rash or Redness - Localized-A-AH

## 2024-01-25 ENCOUNTER — Encounter: Payer: Self-pay | Admitting: Family Medicine

## 2024-01-25 ENCOUNTER — Ambulatory Visit (INDEPENDENT_AMBULATORY_CARE_PROVIDER_SITE_OTHER): Admitting: Family Medicine

## 2024-01-25 VITALS — BP 136/88 | HR 94 | Temp 98.4°F | Ht 63.0 in | Wt 167.8 lb

## 2024-01-25 DIAGNOSIS — B372 Candidiasis of skin and nail: Secondary | ICD-10-CM | POA: Diagnosis not present

## 2024-01-25 MED ORDER — FLUCONAZOLE 100 MG PO TABS: 100.0000 mg | ORAL_TABLET | Freq: Every day | ORAL | 0 refills | Status: AC

## 2024-01-25 MED ORDER — NYSTATIN 100000 UNIT/GM EX POWD: 1.0000 | Freq: Three times a day (TID) | CUTANEOUS | 3 refills | Status: AC

## 2024-01-25 NOTE — Patient Instructions (Signed)
 Follow up as needed or as scheduled START the powder 3x/day in the creases to help treat the infection and dry the area TAKE the Fluconazole  pills daily x7 days STOP the cream Make sure areas are clean and dry- pat dry, don't rub Call with any questions or concerns Stay Safe!  Stay Healthy! Hang in there! Happy Holidays!

## 2024-01-25 NOTE — Progress Notes (Signed)
° °  Subjective:    Patient ID: Heidi Barton, female    DOB: 03/26/1985, 38 y.o.   MRN: [id number]  HPI Rash- pt called PCP to report rash under breasts and was prescribed Nizoral  cream on 12/11.  She is here today b/c rash has worsened rather than improved.  Areas are now painful and itchy.  Is under breasts bilaterally.  Some oozing.   Review of Systems For ROS see HPI     Objective:   Physical Exam Vitals reviewed.  Constitutional:      General: She is not in acute distress.    Appearance: Normal appearance. She is not ill-appearing.  HENT:     Head: Normocephalic and atraumatic.  Skin:    General: Skin is warm.     Findings: Erythema (under breasts and panus creases bilaterally) and rash (hyperpigmented fungal rash under breasts bilaterally and L panus crease) present.  Neurological:     Mental Status: She is alert.           Assessment & Plan:  Fungal dermatitis- new to provider, ongoing for pt.  Worsening over the past week despite use of Nizoral  cream.  Will start Nystatin  powder topically and Fluconazole  daily x1 week.  Reviewed supportive care and red flags that should prompt return.  Pt expressed understanding and is in agreement w/ plan.

## 2024-02-16 ENCOUNTER — Ambulatory Visit: Payer: Self-pay

## 2024-02-16 NOTE — Telephone Encounter (Signed)
 FYI Only or Action Required?: FYI only for provider: appointment scheduled on 02/19/24.  Patient was last seen in primary care on 01/25/2024 by Mahlon Comer BRAVO, MD.  Called Nurse Triage reporting Rash.  Symptoms began several days ago.  Interventions attempted: Nothing.  Symptoms are: unchanged.  Triage Disposition: Call PCP Within 24 Hours  Patient/caregiver understands and will follow disposition?: Yes     Copied from CRM #[id number]. Topic: Clinical - Red Word Triage >> Feb 16, 2024  3:40 PM Victoria A wrote: [facility] that prompted transfer to Nurse Triage: Patient has rash on both hands that are swollen,itchy, painful and spreading. Noticed three days ago    Reason for Disposition  [1] Applying cream or ointment AND [2] causes severe itch, burning or pain  Answer Assessment - Initial Assessment Questions Pt contacted clinic to report rash on bilateral hands x 3 days. Pt states they have been using nystatin  powder as directed but now rash present on hands. Rash is red, itchy, painful; denies fever, no SOB or changes in breathing. Pt has not tried any other medications or interventions at this time, discussed antihistamines and application of cold compress to relieve symptoms. Appointment scheduled for evaluation. Patient agrees with plan of care, and will call back if anything changes, or if symptoms worsen.      1. APPEARANCE of RASH: What does the rash look like? (e.g., blisters, dry flaky skin, red spots, redness, sores)     Red, itchy, swollen rash on bilateral hands   2. LOCATION: Where is the rash located?      Hands   3. NUMBER: How many spots are there?      Red rash  4. SIZE: How big are the spots? (e.g., inches, cm; or compare to size of pinhead, tip of pen, eraser, pea)      N/a   5. ONSET: When did the rash start?      3 days ago   6. ITCHING: Does the rash itch? If Yes, ask: How bad is the itch?  (Scale 0-10; or none, mild, moderate,  severe)     Yes; moderate  7. PAIN: Does the rash hurt? If Yes, ask: How bad is the pain?  (Scale 0-10; or none, mild, moderate, severe)     Yes; moderate   8. OTHER SYMPTOMS: Do you have any other symptoms? (e.g., fever)     No  Protocols used: Rash or Redness - Localized-A-AH

## 2024-02-19 ENCOUNTER — Ambulatory Visit: Admitting: Family Medicine

## 2024-02-19 VITALS — BP 132/84 | HR 78 | Temp 98.2°F | Ht 63.0 in | Wt 168.2 lb

## 2024-02-19 DIAGNOSIS — R21 Rash and other nonspecific skin eruption: Secondary | ICD-10-CM

## 2024-02-19 MED ORDER — DESONIDE 0.05 % EX CREA: TOPICAL_CREAM | Freq: Two times a day (BID) | CUTANEOUS | 0 refills | Status: AC

## 2024-02-19 NOTE — Progress Notes (Signed)
 "  Subjective:     Patient ID: Heidi Barton, female    DOB: Sep 11, 1985, 39 y.o.   MRN: [id number]  Chief Complaint  Patient presents with   Rash    Pt has a rash on both hands that itch x2 weeks    Discussed the use of AI scribe software for clinical note transcription with the patient, who gave verbal consent to proceed.  History of Present Illness Heidi Barton is a 39 year old female who presents with a recurrent rash on her hands. Here w/husband  The rash on her hands initially appeared two weeks ago and is characterized by itchiness, particularly when the skin is dry. She frequently applies lotion to keep the skin moisturized, which helps alleviate the itchiness. The rash is confined to her hands and is not present on her feet or groin. She denies any previous occurrences of this type of rash.   Approximately three weeks ago, she experienced a different rash under her breast, which was treated with a tablet and a powder. This previous rash has mostly resolved, though she still experiences some pain when showering and cleaning the area.  She has not introduced any new soaps or detergents that could account for the rash, and no one else in her family is experiencing similar symptoms. She had been using vitamin D  supplements, but these are not believed to be related to the rash.  She has been careful not to scratch the rash, opting to rub the area instead when it itches.    Health Maintenance Due  Topic Date Due   HPV VACCINES (1 - 3-dose SCDM series) Never done   Mammogram  Never done   DTaP/Tdap/Td (2 - Td or Tdap) 06/19/2023   Influenza Vaccine  Never done   COVID-19 Vaccine (4 - 2025-26 season) 10/09/2023   Hepatitis B Vaccines 19-59 Average Risk (2 of 2 - CpG 2-dose series) 12/05/2023    Past Medical History:  Diagnosis Date   Gestational diabetes    glyburide   Hypothyroidism    Ovarian cyst     Past Surgical History:  Procedure Laterality Date   CESAREAN  SECTION N/A 07/20/2017   Procedure: CESAREAN SECTION;  Surgeon: Rutherford Gain, MD;  Location: [facility];  Service: Obstetrics;  Laterality: N/A;    Current Medications[1]  Allergies[2] ROS neg/noncontributory except as noted HPI/below      Objective:     BP 132/84 (BP Location: Left Arm, Patient Position: Sitting, Cuff Size: Normal)   Pulse 78   Temp 98.2 F (36.8 C) (Temporal)   Ht 5' 3 (1.6 m)   Wt 168 lb 4 oz (76.3 kg)   LMP 02/13/2024 (Approximate)   SpO2 98%   BMI 29.80 kg/m  Wt Readings from Last 3 Encounters:  02/19/24 168 lb 4 oz (76.3 kg)  01/25/24 167 lb 12.8 oz (76.1 kg)  07/12/23 171 lb 4 oz (77.7 kg)    Physical Exam GENERAL: Well developed, well nourished, no acute distress. HEAD EYES EARS NOSE THROAT: Normocephalic, atraumatic, conjunctiva not injected, sclera nonicteric. EXTREMITIES: No edema. MUSCULOSKELETAL: No gross abnormalities. NEUROLOGICAL: Alert and oriented x3, cranial nerves II through XII intact. PSYCHIATRIC: Normal mood, good eye contact. SKIN: Erythematous and oozy skin on hands with potential scarring.              Assessment & Plan:  Rash -     Ambulatory referral to Dermatology  Other orders -     Desonide ; Apply topically 2 (two)  times daily.  Dispense: 30 g; Refill: 0    Assessment and Plan Assessment & Plan Dermatitis   Recurrent dermatitis initially appeared under the breast and now affects the hands, causing itching, tenderness, and potential infection from scratching. Eczema and allergic reaction are considered in the differential diagnosis. No new soaps or detergents have been used, and there is no family history of similar conditions. Vitamin D  supplementation is unlikely to be the cause. There is a risk of pigment changes and scarring. Apply steroid cream to affected areas for a couple of weeks and moisturize frequently with unscented lotions like Eucerin or Cetaphil. Avoid henna and other potential  irritants. Wear gloves when doing dishes to prevent soap irritation. She is referred to dermatology for further evaluation and management, with the option to cancel if symptoms resolve. Monitor for changes in symptoms, such as spreading or worsening, and report if these occur.     Return if symptoms worsen or fail to improve.  Jenkins CHRISTELLA Carrel, MD     [1]  Current Outpatient Medications:    desonide  (DESOWEN ) 0.05 % cream, Apply topically 2 (two) times daily., Disp: 30 g, Rfl: 0   fluconazole  (DIFLUCAN ) 100 MG tablet, Take 1 tablet (100 mg total) by mouth daily., Disp: 7 tablet, Rfl: 0   ketoconazole  (NIZORAL ) 2 % cream, Apply 1 Application topically 2 (two) times daily., Disp: 60 g, Rfl: 0   levothyroxine  (SYNTHROID ) 100 MCG tablet, TAKE 1 TABLET BY MOUTH EVERY DAY, Disp: 90 tablet, Rfl: 3   nystatin  (MYCOSTATIN /NYSTOP ) powder, Apply 1 Application topically 3 (three) times daily., Disp: 60 g, Rfl: 3   Vitamin D , Ergocalciferol , (DRISDOL ) 1.25 MG (50000 UNIT) CAPS capsule, TAKE 1 CAPSULE (50,000 UNITS TOTAL) BY MOUTH EVERY 7 (SEVEN) DAYS, Disp: 12 capsule, Rfl: 0 [2] No Known Allergies  "
